# Patient Record
Sex: Male | Born: 1960 | Race: Black or African American | Hispanic: No | Marital: Single | Smoking: Never smoker
Health system: Southern US, Community
[De-identification: ages and names within clinical notes are randomized; demographics above are authoritative.]

## PROBLEM LIST (undated history)

## (undated) DIAGNOSIS — D471 Chronic myeloproliferative disease: Principal | ICD-10-CM

## (undated) DIAGNOSIS — E782 Mixed hyperlipidemia: Secondary | ICD-10-CM

## (undated) DIAGNOSIS — D75839 Thrombocytosis, unspecified: Secondary | ICD-10-CM

## (undated) DIAGNOSIS — D473 Essential (hemorrhagic) thrombocythemia: Secondary | ICD-10-CM

## (undated) DIAGNOSIS — R079 Chest pain, unspecified: Secondary | ICD-10-CM

---

## 2000-03-03 ENCOUNTER — Emergency Department (HOSPITAL_COMMUNITY): Admission: EM | Admit: 2000-03-03 | Discharge: 2000-03-03 | Payer: Self-pay | Admitting: Emergency Medicine

## 2000-03-03 ENCOUNTER — Encounter: Payer: Self-pay | Admitting: Emergency Medicine

## 2000-08-18 ENCOUNTER — Encounter: Payer: Self-pay | Admitting: Emergency Medicine

## 2000-08-18 ENCOUNTER — Emergency Department (HOSPITAL_COMMUNITY): Admission: EM | Admit: 2000-08-18 | Discharge: 2000-08-18 | Payer: Self-pay | Admitting: Emergency Medicine

## 2000-08-26 ENCOUNTER — Emergency Department (HOSPITAL_COMMUNITY): Admission: EM | Admit: 2000-08-26 | Discharge: 2000-08-26 | Payer: Self-pay | Admitting: Emergency Medicine

## 2001-12-07 ENCOUNTER — Encounter: Payer: Self-pay | Admitting: *Deleted

## 2001-12-07 ENCOUNTER — Emergency Department (HOSPITAL_COMMUNITY): Admission: EM | Admit: 2001-12-07 | Discharge: 2001-12-07 | Payer: Self-pay | Admitting: *Deleted

## 2003-02-15 ENCOUNTER — Emergency Department (HOSPITAL_COMMUNITY): Admission: EM | Admit: 2003-02-15 | Discharge: 2003-02-15 | Payer: Self-pay | Admitting: Emergency Medicine

## 2003-07-21 ENCOUNTER — Emergency Department (HOSPITAL_COMMUNITY): Admission: EM | Admit: 2003-07-21 | Discharge: 2003-07-21 | Payer: Self-pay | Admitting: Emergency Medicine

## 2003-10-15 ENCOUNTER — Inpatient Hospital Stay (HOSPITAL_COMMUNITY): Admission: EM | Admit: 2003-10-15 | Discharge: 2003-10-17 | Payer: Self-pay
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
## 2008-11-23 ENCOUNTER — Ambulatory Visit: Payer: Self-pay | Admitting: Psychiatry

## 2008-11-23 ENCOUNTER — Emergency Department (HOSPITAL_COMMUNITY): Admission: EM | Admit: 2008-11-23 | Discharge: 2008-11-23 | Payer: Self-pay | Admitting: Emergency Medicine

## 2008-11-23 ENCOUNTER — Inpatient Hospital Stay (HOSPITAL_COMMUNITY): Admission: AD | Admit: 2008-11-23 | Discharge: 2008-11-30 | Payer: Self-pay | Admitting: Psychiatry

## 2008-12-01 ENCOUNTER — Emergency Department (HOSPITAL_COMMUNITY): Admission: EM | Admit: 2008-12-01 | Discharge: 2008-12-01 | Payer: Self-pay | Admitting: Emergency Medicine

## 2008-12-01 ENCOUNTER — Inpatient Hospital Stay (HOSPITAL_COMMUNITY): Admission: RE | Admit: 2008-12-01 | Discharge: 2008-12-04 | Payer: Self-pay | Admitting: Psychiatry

## 2009-09-24 ENCOUNTER — Ambulatory Visit (HOSPITAL_COMMUNITY): Admission: RE | Admit: 2009-09-24 | Discharge: 2009-09-24 | Payer: Self-pay | Admitting: Psychiatry

## 2009-09-24 ENCOUNTER — Emergency Department (HOSPITAL_COMMUNITY): Admission: EM | Admit: 2009-09-24 | Discharge: 2009-09-25 | Payer: Self-pay | Admitting: Emergency Medicine

## 2009-09-25 ENCOUNTER — Ambulatory Visit: Payer: Self-pay | Admitting: Psychiatry

## 2009-09-25 ENCOUNTER — Inpatient Hospital Stay (HOSPITAL_COMMUNITY): Admission: RE | Admit: 2009-09-25 | Discharge: 2009-10-01 | Payer: Self-pay | Admitting: Psychiatry

## 2010-04-18 LAB — URINALYSIS, ROUTINE W REFLEX MICROSCOPIC
Bilirubin Urine: NEGATIVE
Glucose, UA: NEGATIVE mg/dL
Hgb urine dipstick: NEGATIVE
Ketones, ur: NEGATIVE mg/dL
Leukocytes, UA: NEGATIVE
Nitrite: NEGATIVE
Specific Gravity, Urine: 1.031 — ABNORMAL HIGH (ref 1.005–1.030)
Urobilinogen, UA: 1 mg/dL (ref 0.0–1.0)

## 2010-04-18 LAB — DIFFERENTIAL
Basophils Relative: 1 % (ref 0–1)
Eosinophils Relative: 5 % (ref 0–5)
Lymphocytes Relative: 27 % (ref 12–46)
Lymphs Abs: 2 10*3/uL (ref 0.7–4.0)
Monocytes Relative: 6 % (ref 3–12)
Neutro Abs: 4.4 10*3/uL (ref 1.7–7.7)

## 2010-04-18 LAB — BASIC METABOLIC PANEL
BUN: 15 mg/dL (ref 6–23)
CO2: 28 mEq/L (ref 19–32)
Calcium: 9.3 mg/dL (ref 8.4–10.5)
GFR calc non Af Amer: 58 mL/min — ABNORMAL LOW (ref >60)
Glucose, Bld: 118 mg/dL — ABNORMAL HIGH (ref 70–99)
Potassium: 3.5 mEq/L (ref 3.5–5.1)
Sodium: 139 mEq/L (ref 135–145)

## 2010-04-18 LAB — CBC
MCV: 86.7 fL (ref 78.0–100.0)
Platelets: 556 10*3/uL — ABNORMAL HIGH (ref 150–400)
RBC: 5.03 MIL/uL (ref 4.22–5.81)
WBC: 7.4 10*3/uL (ref 4.0–10.5)

## 2010-04-18 LAB — URINE MICROSCOPIC-ADD ON

## 2010-04-18 LAB — HEPATIC FUNCTION PANEL
Albumin: 3.8 g/dL (ref 3.5–5.2)
Alkaline Phosphatase: 73 U/L (ref 39–117)

## 2010-04-18 LAB — RAPID URINE DRUG SCREEN, HOSP PERFORMED
Cocaine: POSITIVE — AB
Opiates: NOT DETECTED

## 2010-04-18 LAB — TRICYCLICS SCREEN, URINE: TCA Scrn: NOT DETECTED

## 2010-05-08 LAB — COMPREHENSIVE METABOLIC PANEL
ALT: 36 U/L (ref 0–53)
Albumin: 3.9 g/dL (ref 3.5–5.2)
CO2: 28 mEq/L (ref 19–32)
Creatinine, Ser: 1.17 mg/dL (ref 0.4–1.5)
GFR calc Af Amer: >60 mL/min (ref >60)
GFR calc non Af Amer: >60 mL/min (ref >60)
Potassium: 4.3 mEq/L (ref 3.5–5.1)
Sodium: 137 mEq/L (ref 135–145)
Total Protein: 7.1 g/dL (ref 6.0–8.3)

## 2010-05-08 LAB — CBC
HCT: 42.3 % (ref 39.0–52.0)
Hemoglobin: 14.4 g/dL (ref 13.0–17.0)
Platelets: 414 10*3/uL — ABNORMAL HIGH (ref 150–400)
RBC: 4.74 MIL/uL (ref 4.22–5.81)
RDW: 14.4 % (ref 11.5–15.5)
WBC: 6.8 10*3/uL (ref 4.0–10.5)

## 2010-05-08 LAB — DIFFERENTIAL
Basophils Relative: 1 % (ref 0–1)
Eosinophils Absolute: 0.1 10*3/uL (ref 0.0–0.7)
Eosinophils Absolute: 0.2 10*3/uL (ref 0.0–0.7)
Eosinophils Relative: 1 % (ref 0–5)
Eosinophils Relative: 2 % (ref 0–5)
Lymphs Abs: 2 10*3/uL (ref 0.7–4.0)
Monocytes Relative: 7 % (ref 3–12)
Neutro Abs: 4.3 10*3/uL (ref 1.7–7.7)
Neutrophils Relative %: 63 % (ref 43–77)

## 2010-05-08 LAB — RAPID URINE DRUG SCREEN, HOSP PERFORMED
Amphetamines: NOT DETECTED
Barbiturates: NOT DETECTED
Benzodiazepines: NOT DETECTED
Opiates: NOT DETECTED
Tetrahydrocannabinol: NOT DETECTED

## 2010-05-08 LAB — BASIC METABOLIC PANEL
CO2: 29 mEq/L (ref 19–32)
Calcium: 9.6 mg/dL (ref 8.4–10.5)
Creatinine, Ser: 1.28 mg/dL (ref 0.4–1.5)
Glucose, Bld: 92 mg/dL (ref 70–99)

## 2010-05-08 LAB — ETHANOL
Alcohol, Ethyl (B): <5 mg/dL (ref 0–10)
Alcohol, Ethyl (B): <5 mg/dL (ref 0–10)
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
## 2010-06-20 NOTE — H&P (Signed)
NAME:  ED, Arthur Jensen                                ACCOUNT NO.:  [id number]   MEDICAL RECORD NO.:  [id number]                   PATIENT TYPE:  EMS   LOCATION:  MAJO                                 FACILITY:  [facility]   PHYSICIAN:  Marlan Palau, M.D.               DATE OF BIRTH:  09/09/2002   DATE OF ADMISSION:  10/15/2003  DATE OF DISCHARGE:                                HISTORY & PHYSICAL   HISTORY:  This is a patient of approximate age of 50 who is brought in for  an evaluation of altered mental status.   HISTORY OF PRESENT ILLNESS:  Arthur Jensen is an approximately 50 year old black  male born March 21, 1970, with a history of recent incarceration tonight  for possession of a cocaine pipe and stolen vehicle.  The patient initially  was conversive with the police, but was then told he was under arrest.  The  patient ran from the police, and in the course of his flight, tripped and  deputies caught up with him.  The patient was brought in for an evaluation  as the patient from that point on was not conversive, seemed to be confused  and agitated.   Lab studies were unremarkable, but urine drug screen is positive for  cocaine.  Neurology was asked to see this patient for altered mental status.  The patient has had a CT scan of the head.  By my review, it appeared to be  completely normal with and without contrast.  EKG was read as being atrial  fibrillation, but P waves are clearly present.  I suspect this is a normal  sinus rhythm evaluation.   PAST MEDICAL HISTORY:  Unknown. The patient does have a history of cocaine  abuse, recent altered mental status.   MEDICATIONS:  Prior to admission, unknown.   ALLERGIES:  Unknown.   SOCIAL HISTORY:  Unknown smoking and drinking history.  The patient's  address is unknown.  Social history is unknown whether this patient is  working or married.  It is unknown whether he has any children.   FAMILY HISTORY:  Once again, unknown.   REVIEW OF  SYSTEMS:  Unobtainable.  The patient is nonverbal.   PHYSICAL EXAMINATION:  VITAL SIGNS:  Blood pressure 119/93, heart rate 80,  respiratory rate 18, temperature afebrile.  GENERAL:  This patient is a well-developed black male who is apparently  alert but somewhat restless at the time of examination, nonverbal, moaning  in pain when touched anywhere on the body.  HEENT:  Head is atraumatic, eyes, pupils equal round and reactive to light.  Disks are flat.  NECK:  Relatively supple.  No carotid bruits are noted.  RESPIRATORY:  Examination is clear.  CARDIOVASCULAR:  Examination reveals a regular rate and rhythm, no obvious  murmurs or rubs noted.  ABDOMEN:  Reveals positive sounds, no organomegaly or tenderness noted.  EXTREMITIES:  Without significant edema.  NEUROLOGIC:  Examination is limited.  The patient has symmetric facies, good  symmetric grimace, nonverbal.  Blinks to threat bilaterally.  Full  extraocular movements were seen.  The patient has symmetric motor, tone, and  will not follow motor commands.  Does seem to have some response to pain  sensation on all fours.  Deep tendon reflexes are symmetric.  Toes are  downgoing bilaterally.  Cerebellar testing could not be performed.   LABORATORY DATA:  Notable for sodium of 140, potassium 4.1, chloride 107,  BUN 16, glucose of 77, pH 7.356, pCO2 47, bicarbonate of 26.  CO2 28,  hematocrit 44, hemoglobin of 15, white count of 5.9, hemoglobin 13.9,  hematocrit 41.3.  MCV of 88.0, platelets of 308.  Drug screen is notable for  cocaine, otherwise unremarkable.  White count of 5.9.  Again, hemoglobin  13.9, hematocrit 41.3.  Platelets of 308,000.   Chest x-ray shows no acute process.  CT of the head is normal.  C spine and  MRI scan was also done.  The results are pending.   IMPRESSION:  1.  Altered mental status.  2.  Positive use of cocaine.   This patient's identity is unknown.  The patient will be brought in for  evaluation of  his altered mental status.  Need to rule out psychogenic  response to incarceration.  Rule out seizure event.  Rule out other etiology  such as infectious causes such as herpes encephalitis and cerebrovascular  disease.  The patient is at risk for cocaine vasculitis.  The patient's  examination, however, is nonfocal.   PLAN:  1.  Admission to [facility].  2.  Monitored bed.  3.  N.p.o. for now.  4.  Swallowing evaluation in the a.m.  5.  EEG study.  6.  Consider MRI and lumbar puncture if unusual behavior is noted.  7.  Ativan for sedation if needed.  8.  We will follow the patient's clinical course while in house.                                                Marlan Palau, M.D.    CKW/MEDQ  D:  10/15/2003  T:  10/15/2003  Job:  [id number]   cc:   [facility]  [address] [place]. [address]

## 2010-06-20 NOTE — Procedures (Signed)
ELECTROENCEPHALOGRAPHY NUMBER:  [id number].   This patient is a 50 year old gentleman with a date of birth of September 06, 1960.   TECHNICIAN:  Michael Boston.   REFERRING PHYSICIAN:  Marlan Palau, M.D.   This is a portable EEG 16-channel study with one channel representing heart  rate and rhythm exclusively on this gentleman who is presenting aphasic and  is described as possibly confused.  The patient is also described as  lethargic by the technician.  The question of seizures is raised.  Further  information from the attached consult shows that the patient has undergone a  CT of the head which was normal.  The patient had apparently presented as  Jonny Ruiz Doe without any ability to communicate.  A toxicology screen was  positive.   At the beginning of the study, we were able to identify a posterior dominant  rhythm at about 9 Hz, which emits symmetrically and synchronously from both  posterior hemispheres.  The patient soon became drowsy as indicated by theta  range frequencies and then even shows sleep manifestations frequently seen  as a sharp spike over P4O2, P3O2 and P6O2 at the same time, which seems to  lack any field and is most likely an artifact as it is not seen during the  transverse montages.  The patient's study progresses not showing any focal  abnormalities or epileptiform discharges otherwise.  __________ maneuvers  were not marked.  This is a normal EEG for the patient's age and conscious  state.    Melvyn Novas, M.D.   AO:ZHYQ  D:  10/16/2003 13:06:02  T:  10/16/2003 15:56:00  Job #:  [id number]

## 2011-08-10 ENCOUNTER — Emergency Department (HOSPITAL_COMMUNITY)
Admission: EM | Admit: 2011-08-10 | Discharge: 2011-08-10 | Disposition: A | Discharge status: Home / Self Care | Payer: Self-pay | Attending: Emergency Medicine | Admitting: Emergency Medicine

## 2011-08-10 ENCOUNTER — Encounter (HOSPITAL_COMMUNITY): Payer: Self-pay | Admitting: Emergency Medicine

## 2011-08-10 DIAGNOSIS — R21 Rash and other nonspecific skin eruption: Secondary | ICD-10-CM | POA: Insufficient documentation

## 2011-08-10 MED ORDER — HYDROCORTISONE 1 % EX CREA: TOPICAL_CREAM | CUTANEOUS | Status: AC

## 2011-08-10 MED ORDER — DEXAMETHASONE SODIUM PHOSPHATE 10 MG/ML IJ SOLN
10.0000 mg | Freq: Once | INTRAMUSCULAR | Status: AC
Start: 2011-08-10 — End: 2011-08-10
  Administered 2011-08-10: 10 mg via INTRAMUSCULAR
  Filled 2011-08-10: qty 1

## 2011-08-10 MED ORDER — PREDNISONE 10 MG PO TABS: 40.0000 mg | ORAL_TABLET | Freq: Every day | ORAL | Status: DC

## 2011-08-10 NOTE — ED Notes (Signed)
2 week hx of multiple swollen, raised areas, itching. Family not affected

## 2011-08-10 NOTE — ED Provider Notes (Signed)
History     CSN: [id number]  Arrival date & time 08/10/11  1106   First MD Initiated Contact with Patient 08/10/11 1109      Chief Complaint  Patient presents with  . Rash    2 week hx of raised, itching rash    (Consider location/radiation/quality/duration/timing/severity/associated sxs/prior treatment) HPI Comments: Patient reports that he has had a rash for the past 2 weeks.  Rash is pruritic.  He has put Benadryl cream on the area, which he reports has helped.  No fever or chills.  No contacts with similar rash.  He denies any recent changes in lotions, detergents, or soaps.    Patient is a 51 y.o. male presenting with rash. The history is provided by the patient.  Rash  This is a new problem. Episode onset: two weeks. The problem has not changed since onset.The problem is associated with nothing. There has been no fever. The rash is present on the right arm, left arm, right lower leg and left lower leg. The patient is experiencing no pain. Associated symptoms include itching. Pertinent negatives include no blisters, no pain and no weeping.    History reviewed. No pertinent past medical history.  History reviewed. No pertinent past surgical history.  Family History  Problem Relation Age of Onset  . Hypertension Father     History  Substance Use Topics  . Smoking status: Never Smoker   . Smokeless tobacco: Not on file  . Alcohol Use: No      Review of Systems  Constitutional: Negative for fever and chills.  HENT: Negative for sore throat, neck pain and neck stiffness.   Respiratory: Negative for shortness of breath.   Cardiovascular: Negative for chest pain.  Gastrointestinal: Negative for nausea and vomiting.  Skin: Positive for itching and rash.  Neurological: Negative for headaches.    Allergies  Review of patient's allergies indicates no known allergies.  Home Medications   Current Outpatient Rx  Name Route Sig Dispense Refill  . DIPHENHYDRAMINE HCL 2 %  EX CREA Topical Apply topically 3 (three) times daily as needed.    Marland Kitchen HYDROCORTISONE 1 % EX CREA  Apply to affected area 2 times daily 15 g 0  . PREDNISONE 10 MG PO TABS Oral Take 4 tablets (40 mg total) by mouth daily. 10 tablet 0    BP 130/79  Pulse 62  Temp 98.3 F (36.8 C) (Oral)  Resp 18  SpO2 97%  Physical Exam  Nursing note and vitals reviewed. Constitutional: He appears well-developed and well-nourished. No distress.  HENT:  Head: Normocephalic and atraumatic.  Mouth/Throat: Oropharynx is clear and moist.  Neck: Normal range of motion. Neck supple.  Cardiovascular: Normal rate, regular rhythm and normal heart sounds.   Pulmonary/Chest: Effort normal and breath sounds normal.  Neurological: He is alert.  Skin: Skin is warm and dry. He is not diaphoretic.       Erythematous papular rash located on both arms, both legs, and posterior neck.  No petechiae or purpura.    Psychiatric: He has a normal mood and affect.    ED Course  Procedures (including critical care time)  Labs Reviewed - No data to display No results found.   1. Rash       MDM  Patient presenting with rash that has been present for the past 2 weeks.  No petechiae or purpura.  No pain.  Patient afebrile.  No systemic symptoms.  Patient discharged home.  Return precautions discussed.  Patient explained the importance of having a Primary Care Physician.        Pascal Lux [place], PA-C 08/10/11 [phone]

## 2011-08-10 NOTE — ED Provider Notes (Signed)
Medical screening examination/treatment/procedure(s) were performed by non-physician practitioner and as supervising physician I was immediately available for consultation/collaboration.  Doug Sou, MD 08/10/11 [phone]

## 2011-10-02 ENCOUNTER — Ambulatory Visit: Payer: Self-pay | Admitting: Internal Medicine

## 2013-09-20 DIAGNOSIS — D473 Essential (hemorrhagic) thrombocythemia: Secondary | ICD-10-CM | POA: Insufficient documentation

## 2013-09-20 DIAGNOSIS — K219 Gastro-esophageal reflux disease without esophagitis: Secondary | ICD-10-CM | POA: Insufficient documentation

## 2013-09-20 DIAGNOSIS — D649 Anemia, unspecified: Secondary | ICD-10-CM | POA: Insufficient documentation

## 2016-12-17 ENCOUNTER — Ambulatory Visit (INDEPENDENT_AMBULATORY_CARE_PROVIDER_SITE_OTHER): Payer: Self-pay | Admitting: Physician Assistant

## 2017-01-18 ENCOUNTER — Other Ambulatory Visit: Payer: Self-pay

## 2017-01-18 ENCOUNTER — Emergency Department (HOSPITAL_COMMUNITY)
Admission: EM | Admit: 2017-01-18 | Discharge: 2017-01-21 | Disposition: A | Discharge status: Home / Self Care | Payer: Self-pay | Attending: Emergency Medicine | Admitting: Emergency Medicine

## 2017-01-18 ENCOUNTER — Encounter (HOSPITAL_COMMUNITY): Payer: Self-pay | Admitting: Emergency Medicine

## 2017-01-18 DIAGNOSIS — F1410 Cocaine abuse, uncomplicated: Secondary | ICD-10-CM

## 2017-01-18 DIAGNOSIS — R45851 Suicidal ideations: Secondary | ICD-10-CM | POA: Insufficient documentation

## 2017-01-18 DIAGNOSIS — F1414 Cocaine abuse with cocaine-induced mood disorder: Secondary | ICD-10-CM | POA: Diagnosis present

## 2017-01-18 DIAGNOSIS — R0602 Shortness of breath: Secondary | ICD-10-CM | POA: Insufficient documentation

## 2017-01-18 LAB — CBC
HEMATOCRIT: 40.9 % (ref 39.0–52.0)
HEMOGLOBIN: 13.9 g/dL (ref 13.0–17.0)
MCH: 36.2 pg — ABNORMAL HIGH (ref 26.0–34.0)
MCHC: 34 g/dL (ref 30.0–36.0)
MCV: 106.5 fL — ABNORMAL HIGH (ref 78.0–100.0)
Platelets: 859 10*3/uL — ABNORMAL HIGH (ref 150–400)
RBC: 3.84 MIL/uL — AB (ref 4.22–5.81)
RDW: 13 % (ref 11.5–15.5)
WBC: 8.5 10*3/uL (ref 4.0–10.5)

## 2017-01-18 LAB — RAPID URINE DRUG SCREEN, HOSP PERFORMED
AMPHETAMINES: NOT DETECTED
BARBITURATES: NOT DETECTED
BENZODIAZEPINES: NOT DETECTED
COCAINE: POSITIVE — AB
OPIATES: NOT DETECTED
TETRAHYDROCANNABINOL: NOT DETECTED

## 2017-01-18 LAB — COMPREHENSIVE METABOLIC PANEL
ALBUMIN: 3.9 g/dL (ref 3.5–5.0)
ALK PHOS: 58 U/L (ref 38–126)
ALT: 22 U/L (ref 17–63)
ANION GAP: 7 (ref 5–15)
AST: 26 U/L (ref 15–41)
BUN: 13 mg/dL (ref 6–20)
CALCIUM: 8.9 mg/dL (ref 8.9–10.3)
CHLORIDE: 105 mmol/L (ref 101–111)
CO2: 27 mmol/L (ref 22–32)
Creatinine, Ser: 1.25 mg/dL — ABNORMAL HIGH (ref 0.61–1.24)
GFR calc Af Amer: >60 mL/min (ref >60)
GFR calc non Af Amer: >60 mL/min (ref >60)
GLUCOSE: 120 mg/dL — AB (ref 65–99)
Potassium: 3.4 mmol/L — ABNORMAL LOW (ref 3.5–5.1)
Sodium: 139 mmol/L (ref 135–145)
Total Bilirubin: 0.9 mg/dL (ref 0.3–1.2)
Total Protein: 6.6 g/dL (ref 6.5–8.1)

## 2017-01-18 LAB — ACETAMINOPHEN LEVEL

## 2017-01-18 LAB — ETHANOL: Alcohol, Ethyl (B): <10 mg/dL (ref <10)

## 2017-01-18 LAB — SALICYLATE LEVEL

## 2017-01-18 MED ORDER — LORAZEPAM 1 MG PO TABS
1.0000 mg | ORAL_TABLET | Freq: Once | ORAL | Status: AC
  Administered 2017-01-18: 1 mg via ORAL
  Filled 2017-01-18: qty 1

## 2017-01-18 NOTE — ED Triage Notes (Signed)
Pt presents by [employer] for evaluation of Suicidal ideation without any plan. Pt reports to using crack cocaine today and then began having a panic attack and suicidal ideation.

## 2017-01-18 NOTE — ED Notes (Signed)
Patient stated he feels better after the Ativan. Denies pain, SI/HI, AH/VH at this time. Safety maintained. Will continue to monitor patient.

## 2017-01-18 NOTE — BH Assessment (Addendum)
Assessment Note  Arthur Jensen is an 56 y.o. male. Pt came in after having thoughts of wanting to kill himself.  He stated he is tired of living and wants to die.  His plan is to jump off of the bridge on [address].  He stated he was upset because his daughter and mother are upset about his cocaine use.  His daughter was calling the pt and he reported this is why he did not jump.  He denies any previous attempts or any family history of suicide attempts in his family.  He had a suicidal gesture "15 years ago" where he had a gun in his hand and did not pull the trigger.  He was last hospitalized in 2011 and 2010 for mental health reasons.  He is not currently seeing a counselor or a psychiatrist.    The pt reported he goes on binges where he uses about 20-40 dollars worth of cocaine.  He has been using cocaine since he was 56 years old.  His last use of cocaine was today (01/18/2017).  He denied any other SA use.  His UDS was only positive for cocaine.  His blood alcohol level was 0.  He stated he has visual hallucinations.  Yesterday he saw the grim ripper.  The pt has been using cocaine for the past few days.  He stated he has not slept the past few days and has lost about 30 lbs in the past 2 months.  The pt is single and is currently homeless.  He denies any current criminal charges.  The pt denies HI  Diagnosis: F33.2 Major depressive disorder, Recurrent episode, Severe, F14.20 Cocaine use disorder, Severe    Past Medical History: History reviewed. No pertinent past medical history.  History reviewed. No pertinent surgical history.  Family History:  Family History  Problem Relation Age of Onset  . Hypertension Father     Social History:  reports that  has never smoked. he has never used smokeless tobacco. He reports that he drinks alcohol. He reports that he uses drugs. Drug: Cocaine.  Additional Social History:  Alcohol / Drug Use Prescriptions: See MAR History of alcohol  / drug use?: Yes Longest period of sobriety (when/how long): unknown Negative Consequences of Use: Legal Substance #1 Name of Substance 1: cocaine 1 - Age of First Use: 22 1 - Amount (size/oz): 20-40 dollars worth 1 - Frequency: "a few times a month" 1 - Duration: 30 years 1 - Last Use / Amount: 01/18/2017  CIWA: CIWA-Ar BP: (!) 138/93 Pulse Rate: 99 COWS:    Allergies: No Known Allergies  Home Medications:  (Not in a hospital admission)  OB/GYN Status:  No LMP for male patient.  General Assessment Data Location of Assessment: [facility] ED TTS Assessment: In system Is this a Tele or Face-to-Face Assessment?: Face-to-Face Is this an Initial Assessment or a Re-assessment for this encounter?: Initial Assessment Marital status: Single Maiden name: NA Living Arrangements: Alone, Other (Comment)(homeless) Can pt return to current living arrangement?: Yes Admission Status: Voluntary Is patient capable of signing voluntary admission?: Yes Referral Source: Self/Family/Friend Insurance type: None     Crisis Care Plan Living Arrangements: Alone, Other (Comment)(homeless) Legal Guardian: Other:(Self) Name of Psychiatrist: none Name of Therapist: none  Education Status Is patient currently in school?: No Current Grade: NA Highest grade of school patient has completed: 12th Name of school: NA Contact person: NA  Risk to self with the past 6 months Suicidal Ideation: Yes-Currently Present Has  patient been a risk to self within the past 6 months prior to admission? : Yes Suicidal Intent: Yes-Currently Present Has patient had any suicidal intent within the past 6 months prior to admission? : Yes Is patient at risk for suicide?: Yes Suicidal Plan?: Yes-Currently Present Has patient had any suicidal plan within the past 6 months prior to admission? : Yes Specify Current Suicidal Plan: [place] [address] Access to Means: Yes Specify Access to Suicidal Means: Can get  to the bridge What has been your use of drugs/alcohol within the last 12 months?: cocaine use Previous Attempts/Gestures: Yes How many times?: 1 Other Self Harm Risks: cocaine use Triggers for Past Attempts: Family contact Intentional Self Injurious Behavior: None Family Suicide History: No Recent stressful life event(s): Conflict (Comment)(arguement with mother and daughter) Persecutory voices/beliefs?: No Depression: Yes Depression Symptoms: Insomnia, Isolating, Fatigue, Guilt, Loss of interest in usual pleasures, Feeling worthless/self pity Substance abuse history and/or treatment for substance abuse?: Yes Suicide prevention information given to non-admitted patients: Not applicable  Risk to Others within the past 6 months Homicidal Ideation: No Does patient have any lifetime risk of violence toward others beyond the six months prior to admission? : No Thoughts of Harm to Others: No Current Homicidal Intent: No Current Homicidal Plan: No Access to Homicidal Means: No Identified Victim: NA History of harm to others?: No Assessment of Violence: None Noted Violent Behavior Description: NA Does patient have access to weapons?: No Criminal Charges Pending?: No Does patient have a court date: No Is patient on probation?: No  Psychosis Hallucinations: Visual Delusions: None noted  Mental Status Report Appearance/Hygiene: In scrubs, Unremarkable Eye Contact: Fair Motor Activity: Freedom of movement, Unremarkable Speech: Soft, Logical/coherent Level of Consciousness: Alert Mood: Depressed, Anxious Affect: Anxious, Depressed Anxiety Level: Moderate Thought Processes: Coherent, Relevant Judgement: Impaired Orientation: Person, Place, Time, Situation, Appropriate for developmental age Obsessive Compulsive Thoughts/Behaviors: None  Cognitive Functioning Concentration: Decreased Memory: Recent Intact, Remote Intact IQ: Average Insight: Poor Impulse Control: Poor Appetite:  Poor Weight Loss: 30 Weight Gain: 0 Sleep: Decreased Total Hours of Sleep: 3 Vegetative Symptoms: None  ADLScreening [facility] Assessment Services) Patient's cognitive ability adequate to safely complete daily activities?: Yes Patient able to express need for assistance with ADLs?: Yes Independently performs ADLs?: Yes (appropriate for developmental age)  Prior Inpatient Therapy Prior Inpatient Therapy: Yes Prior Therapy Dates: 2011, 2010 [facility] Reason for Treatment: depression and SA use  Prior Outpatient Therapy Prior Outpatient Therapy: No Prior Therapy Dates: NA Prior Therapy Facilty/Provider(s): NA Reason for Treatment: NA Does patient have an ACCT team?: No Does patient have Intensive In-House Services?  : No Does patient have [facility] services? : No Does patient have P4CC services?: No  ADL Screening (condition at time of admission) Patient's cognitive ability adequate to safely complete daily activities?: Yes Patient able to express need for assistance with ADLs?: Yes Independently performs ADLs?: Yes (appropriate for developmental age)       Abuse/Neglect Assessment (Assessment to be complete while patient is alone) Abuse/Neglect Assessment Can Be Completed: Yes Physical Abuse: Denies Verbal Abuse: Denies Sexual Abuse: Denies Exploitation of patient/patient's resources: Denies Self-Neglect: Denies Values / Beliefs Cultural Requests During Hospitalization: None Spiritual Requests During Hospitalization: None Consults Spiritual Care Consult Needed: No Social Work Consult Needed: No Merchant navy officerAdvance Directives (For Healthcare) Does Patient Have a Medical Advance Directive?: No    Additional Information 1:1 In Past 12 Months?: No CIRT Risk: No Elopement Risk: No Does patient  have medical clearance?: Yes     Disposition:  Disposition Initial Assessment Completed for this Encounter: Yes Disposition of Patient: Pending Review  with psychiatrist   Spoke with Karleen HampshireSpencer, NP and he recommended pt to be observed overnight for safety and stabilization.  Will be reassessed in the AM.  Spoke with Dr. Lynelle DoctorKnapp and informed him of the disposition.   On Site Evaluation by:   Reviewed with Physician:    Ottis StainGarvin, Ahnika Hannibal Jermaine 01/18/2017 9:44 PM

## 2017-01-18 NOTE — ED Provider Notes (Signed)
[facility]-EMERGENCY DEPT Provider Note   CSN: [id number] Arrival date & time: 01/18/17  1932     History   Chief Complaint Chief Complaint  Patient presents with  . Suicidal    HPI Arthur Jensen H Swaminathan is a 56 y.o. male.  HPI Patient presented to the emergency room for evaluation of suicidal ideation.  Patient states he was using cocaine today.  It made him feel very anxious.  He started to feel depressed and suicidal.  He does not have any specific plan.  Patient states he has a remote history of having trouble with depression in the past.  He denies any trouble with chest pain.  He did feel little short of breath earlier.  He denies any vomiting diarrhea numbness or other complaints. History reviewed. No pertinent past medical history.  There are no active problems to display for this patient.   History reviewed. No pertinent surgical history.     Home Medications    Prior to Admission medications   Medication Sig Start Date End Date Taking? Authorizing Provider  diphenhydrAMINE (BENADRYL) 2 % cream Apply topically 3 (three) times daily as needed.    [provider]  predniSONE (DELTASONE) 10 MG tablet Take 4 tablets (40 mg total) by mouth daily. 08/10/11   Santiago GladLaisure, Heather, PA-C    Family History Family History  Problem Relation Age of Onset  . Hypertension Father     Social History Social History   Tobacco Use  . Smoking status: Never Smoker  . Smokeless tobacco: Never Used  Substance Use Topics  . Alcohol use: Yes  . Drug use: Yes    Types: Cocaine     Allergies   Patient has no known allergies.   Review of Systems Review of Systems  All other systems reviewed and are negative.    Physical Exam Updated Vital Signs BP (!) 138/93 (BP Location: Left Arm)   Pulse 99   Temp 98.9 F (37.2 C) (Oral)   Resp 19   Ht 1.791 m (5' 10.5")   Wt 90.7 kg (200 lb)   SpO2 100%   BMI 28.29 kg/m   Physical Exam    Constitutional: He appears well-developed and well-nourished. No distress.  HENT:  Head: Normocephalic and atraumatic.  Right Ear: External ear normal.  Left Ear: External ear normal.  Eyes: Conjunctivae are normal. Right eye exhibits no discharge. Left eye exhibits no discharge. No scleral icterus.  Neck: Neck supple. No tracheal deviation present.  Cardiovascular: Normal rate, regular rhythm and intact distal pulses.  Pulmonary/Chest: Effort normal and breath sounds normal. No stridor. No respiratory distress. He has no wheezes. He has no rales.  Abdominal: Soft. Bowel sounds are normal. He exhibits no distension. There is no tenderness. There is no rebound and no guarding.  Musculoskeletal: He exhibits no edema or tenderness.  Neurological: He is alert. He has normal strength. No cranial nerve deficit (no facial droop, extraocular movements intact, no slurred speech) or sensory deficit. He exhibits normal muscle tone. He displays no seizure activity. Coordination normal.  Skin: Skin is warm and dry. No rash noted.  Psychiatric: His mood appears anxious. He expresses suicidal ideation.  Nursing note and vitals reviewed.    ED Treatments / Results  Labs (all labs ordered are listed, but only abnormal results are displayed) Labs Reviewed  COMPREHENSIVE METABOLIC PANEL - Abnormal; Notable for the following components:      Result Value   Potassium 3.4 (*)  Glucose, Bld 120 (*)    Creatinine, Ser 1.25 (*)    All other components within normal limits  ACETAMINOPHEN LEVEL - Abnormal; Notable for the following components:   Acetaminophen (Tylenol), Serum <10 (*)    All other components within normal limits  CBC - Abnormal; Notable for the following components:   RBC 3.84 (*)    MCV 106.5 (*)    MCH 36.2 (*)    Platelets 859 (*)    All other components within normal limits  RAPID URINE DRUG SCREEN, HOSP PERFORMED - Abnormal; Notable for the following components:   Cocaine POSITIVE  (*)    All other components within normal limits  ETHANOL  SALICYLATE LEVEL    EKG  EKG Interpretation  Date/Time:  Monday January 18 2017 20:01:33 EST Ventricular Rate:  85 PR Interval:    QRS Duration: 91 QT Interval:  355 QTC Calculation: 425 R Axis:   23 Text Interpretation:  Sinus rhythm Borderline T abnormalities, inferior leads Baseline wander in lead(s) V4 V6 No old tracing to compare Confirmed by Linwood DibblesKnapp, Latoya Maulding [phone]([id number]) on 01/18/2017 8:14:35 PM       Radiology No results found.  Procedures Procedures (including critical care time)  Medications Ordered in ED Medications  LORazepam (ATIVAN) tablet 1 mg (1 mg Oral Given 01/18/17 2043)     Initial Impression / Assessment and Plan / ED Course  I have reviewed the triage vital signs and the nursing notes.  Pertinent labs & imaging results that were available during my care of the patient were reviewed by me and considered in my medical decision making (see chart for details).   Patient presented to the emergency room for evaluation of suicidal ideation associated with crack cocaine abuse.  Patient's vital signs are stable.  I do not see any signs of any serious complications associated continues today.  The patient is medically cleared to be evaluated by the behavioral health team for his suicidal ideation  Final Clinical Impressions(s) / ED Diagnoses   Final diagnoses:  Suicidal ideation  Cocaine abuse [facility](HCC)    ED Discharge Orders    None       Linwood DibblesKnapp, Norva Bowe, MD 01/18/17 2106

## 2017-01-18 NOTE — ED Notes (Signed)
Pt reports improvement in anxiety after Ativan.

## 2017-01-19 ENCOUNTER — Emergency Department (HOSPITAL_COMMUNITY): Payer: Self-pay

## 2017-01-19 DIAGNOSIS — F1414 Cocaine abuse with cocaine-induced mood disorder: Secondary | ICD-10-CM | POA: Diagnosis present

## 2017-01-19 DIAGNOSIS — R45851 Suicidal ideations: Secondary | ICD-10-CM

## 2017-01-19 DIAGNOSIS — F1494 Cocaine use, unspecified with cocaine-induced mood disorder: Secondary | ICD-10-CM

## 2017-01-19 LAB — URINALYSIS, ROUTINE W REFLEX MICROSCOPIC
Bilirubin Urine: NEGATIVE
Glucose, UA: NEGATIVE mg/dL
Hgb urine dipstick: NEGATIVE
KETONES UR: NEGATIVE mg/dL
LEUKOCYTES UA: NEGATIVE
NITRITE: NEGATIVE
PH: 7 (ref 5.0–8.0)
Protein, ur: NEGATIVE mg/dL
Specific Gravity, Urine: 1.018 (ref 1.005–1.030)

## 2017-01-19 LAB — CBC
HEMATOCRIT: 38.8 % — AB (ref 39.0–52.0)
Hemoglobin: 12.9 g/dL — ABNORMAL LOW (ref 13.0–17.0)
MCH: 35.9 pg — ABNORMAL HIGH (ref 26.0–34.0)
MCHC: 33.2 g/dL (ref 30.0–36.0)
MCV: 108.1 fL — ABNORMAL HIGH (ref 78.0–100.0)
Platelets: 730 10*3/uL — ABNORMAL HIGH (ref 150–400)
RBC: 3.59 MIL/uL — ABNORMAL LOW (ref 4.22–5.81)
RDW: 13.1 % (ref 11.5–15.5)
WBC: 7.7 10*3/uL (ref 4.0–10.5)

## 2017-01-19 LAB — INFLUENZA PANEL BY PCR (TYPE A & B)
INFLBPCR: NEGATIVE
Influenza A By PCR: POSITIVE — AB

## 2017-01-19 MED ORDER — OSELTAMIVIR PHOSPHATE 75 MG PO CAPS
75.0000 mg | ORAL_CAPSULE | Freq: Two times a day (BID) | ORAL | Status: DC
  Administered 2017-01-19 – 2017-01-21 (×5): 75 mg via ORAL
  Filled 2017-01-19 (×5): qty 1

## 2017-01-19 MED ORDER — ACETAMINOPHEN 325 MG PO TABS
650.0000 mg | ORAL_TABLET | Freq: Four times a day (QID) | ORAL | Status: DC | PRN
  Administered 2017-01-19 – 2017-01-20 (×4): 650 mg via ORAL
  Filled 2017-01-19 (×4): qty 2

## 2017-01-19 MED ORDER — ALBUTEROL SULFATE HFA 108 (90 BASE) MCG/ACT IN AERS
2.0000 | INHALATION_SPRAY | RESPIRATORY_TRACT | Status: DC | PRN
  Administered 2017-01-19 – 2017-01-20 (×2): 2 via RESPIRATORY_TRACT
  Filled 2017-01-19 (×2): qty 6.7

## 2017-01-19 MED ORDER — ACETAMINOPHEN 500 MG PO TABS
1000.0000 mg | ORAL_TABLET | Freq: Once | ORAL | Status: AC
  Administered 2017-01-19: 1000 mg via ORAL
  Filled 2017-01-19: qty 2

## 2017-01-19 NOTE — Consult Note (Signed)
[facility] Face-to-Face Psychiatry Consult   Reason for Consult:  SI Referring Physician:  EDP Patient Identification: Arthur Jensen MRN:  [id number] Principal Diagnosis: Cocaine abuse with cocaine-induced mood disorder ([place]) Diagnosis:  There are no active problems to display for this patient.   Total Time spent with patient: 45 minutes  Subjective:   Arthur Jensen is a 56 y.o. male patient admitted with SI.  HPI:   Per chart review, patient was admitted with SI and a plan to jump off a bridge after an argument with his daughter and mother who were upset about his cocaine use. He aborted his attempt because his daughter called him. Today he reports ongoing suicidal thoughts. He is upset that he has disappointed so many people. He reports that he is homeless and was recently kicked out of his mother's house. He is not taking medications for his mood. He reports CAH to end his life.   Past Psychiatric History: Cocaine abuse and alcohol abuse.  Risk to Self: Suicidal Ideation: Yes-Currently Present Suicidal Intent: Yes-Currently Present Is patient at risk for suicide?: Yes Suicidal Plan?: Yes-Currently Present Specify Current Suicidal Plan: Jump off bridge on [address] Access to Means: Yes Specify Access to Suicidal Means: Can get to the bridge What has been your use of drugs/alcohol within the last 12 months?: cocaine use How many times?: 1 Other Self Harm Risks: cocaine use Triggers for Past Attempts: Family contact Intentional Self Injurious Behavior: None Risk to Others: Homicidal Ideation: No Thoughts of Harm to Others: No Current Homicidal Intent: No Current Homicidal Plan: No Access to Homicidal Means: No Identified Victim: NA History of harm to others?: No Assessment of Violence: None Noted Violent Behavior Description: NA Does patient have access to weapons?: No Criminal Charges Pending?: No Does patient have a court date: No Prior Inpatient Therapy: Prior  Inpatient Therapy: Yes Prior Therapy Dates: 2011, 2010 Prior Therapy Facilty/Provider(s): He reports [facility] but he has no documented medical records from hospitalization at [facility].  Reason for Treatment: depression and SA use Prior Outpatient Therapy: Prior Outpatient Therapy: No Prior Therapy Dates: NA Prior Therapy Facilty/Provider(s): NA Reason for Treatment: NA Does patient have an ACCT team?: No Does patient have Intensive In-House Services?  : No Does patient have [facility] services? : No Does patient have P4CC services?: No  Past Medical History: History reviewed. No pertinent past medical history. History reviewed. No pertinent surgical history. Family History:  Family History  Problem Relation Age of Onset  . Hypertension Father    Family Psychiatric  History: His mother has a history of self-injurious behaviors.  Social History:  Social History   Substance and Sexual Activity  Alcohol Use Yes     Social History   Substance and Sexual Activity  Drug Use Yes  . Types: Cocaine    Social History   Socioeconomic History  . Marital status: Single    Spouse name: None  . Number of children: None  . Years of education: None  . Highest education level: None  Social Needs  . Financial resource strain: None  . Food insecurity - worry: None  . Food insecurity - inability: None  . Transportation needs - medical: None  . Transportation needs - non-medical: None  Occupational History  . None  Tobacco Use  . Smoking status: Never Smoker  . Smokeless tobacco: Never Used  Substance and Sexual Activity  . Alcohol use: Yes  . Drug use: Yes    Types: Cocaine  . Sexual  activity: None  Other Topics Concern  . None  Social History Narrative  . None   Additional Social History: He is unemployed and homeless. He denies other illicit substance use besides cocaine. He has a history of alcohol use.     Allergies:  No Known Allergies  Labs:  Results for orders placed or  performed during the hospital encounter of 01/18/17 (from the past 48 hour(s))  Comprehensive metabolic panel     Status: Abnormal   Collection Time: 01/18/17  8:10 PM  Result Value Ref Range   Sodium 139 135 - 145 mmol/L   Potassium 3.4 (L) 3.5 - 5.1 mmol/L   Chloride 105 101 - 111 mmol/L   CO2 27 22 - 32 mmol/L   Glucose, Bld 120 (H) 65 - 99 mg/dL   BUN 13 6 - 20 mg/dL   Creatinine, Ser 1.25 (H) 0.61 - 1.24 mg/dL   Calcium 8.9 8.9 - 10.3 mg/dL   Total Protein 6.6 6.5 - 8.1 g/dL   Albumin 3.9 3.5 - 5.0 g/dL   AST 26 15 - 41 U/L   ALT 22 17 - 63 U/L   Alkaline Phosphatase 58 38 - 126 U/L   Total Bilirubin 0.9 0.3 - 1.2 mg/dL   GFR calc non Af Amer >60 >60 mL/min   GFR calc Af Amer >60 >60 mL/min    Comment: (NOTE) The eGFR has been calculated using the CKD EPI equation. This calculation has not been validated in all clinical situations. eGFR's persistently <60 mL/min signify possible Chronic Kidney Disease.    Anion gap 7 5 - 15  Ethanol     Status: None   Collection Time: 01/18/17  8:10 PM  Result Value Ref Range   Alcohol, Ethyl (B) <10 <10 mg/dL    Comment:        LOWEST DETECTABLE LIMIT FOR SERUM ALCOHOL IS 10 mg/dL FOR MEDICAL PURPOSES ONLY   Salicylate level     Status: None   Collection Time: 01/18/17  8:10 PM  Result Value Ref Range   Salicylate Lvl <7.1 2.8 - 30.0 mg/dL  Acetaminophen level     Status: Abnormal   Collection Time: 01/18/17  8:10 PM  Result Value Ref Range   Acetaminophen (Tylenol), Serum <10 (L) 10 - 30 ug/mL    Comment:        THERAPEUTIC CONCENTRATIONS VARY SIGNIFICANTLY. A RANGE OF 10-30 ug/mL MAY BE AN EFFECTIVE CONCENTRATION FOR MANY PATIENTS. HOWEVER, SOME ARE BEST TREATED AT CONCENTRATIONS OUTSIDE THIS RANGE. ACETAMINOPHEN CONCENTRATIONS >150 ug/mL AT 4 HOURS AFTER INGESTION AND >50 ug/mL AT 12 HOURS AFTER INGESTION ARE OFTEN ASSOCIATED WITH TOXIC REACTIONS.   cbc     Status: Abnormal   Collection Time: 01/18/17  8:10 PM   Result Value Ref Range   WBC 8.5 4.0 - 10.5 K/uL   RBC 3.84 (L) 4.22 - 5.81 MIL/uL   Hemoglobin 13.9 13.0 - 17.0 g/dL   HCT 40.9 39.0 - 52.0 %   MCV 106.5 (H) 78.0 - 100.0 fL   MCH 36.2 (H) 26.0 - 34.0 pg   MCHC 34.0 30.0 - 36.0 g/dL   RDW 13.0 11.5 - 15.5 %   Platelets 859 (H) 150 - 400 K/uL  Rapid urine drug screen (hospital performed)     Status: Abnormal   Collection Time: 01/18/17  8:28 PM  Result Value Ref Range   Opiates NONE DETECTED NONE DETECTED   Cocaine POSITIVE (A) NONE DETECTED   Benzodiazepines NONE DETECTED NONE  DETECTED   Amphetamines NONE DETECTED NONE DETECTED   Tetrahydrocannabinol NONE DETECTED NONE DETECTED   Barbiturates NONE DETECTED NONE DETECTED    Comment:        DRUG SCREEN FOR MEDICAL PURPOSES ONLY.  IF CONFIRMATION IS NEEDED FOR ANY PURPOSE, NOTIFY LAB WITHIN 5 DAYS.        LOWEST DETECTABLE LIMITS FOR URINE DRUG SCREEN Drug Class       Cutoff (ng/mL) Amphetamine      1000 Barbiturate      200 Benzodiazepine   174 Tricyclics       944 Opiates          300 Cocaine          300 THC              50   CBC     Status: Abnormal   Collection Time: 01/19/17 10:02 AM  Result Value Ref Range   WBC 7.7 4.0 - 10.5 K/uL   RBC 3.59 (L) 4.22 - 5.81 MIL/uL   Hemoglobin 12.9 (L) 13.0 - 17.0 g/dL   HCT 38.8 (L) 39.0 - 52.0 %   MCV 108.1 (H) 78.0 - 100.0 fL   MCH 35.9 (H) 26.0 - 34.0 pg   MCHC 33.2 30.0 - 36.0 g/dL   RDW 13.1 11.5 - 15.5 %   Platelets 730 (H) 150 - 400 K/uL  Urinalysis, Routine w reflex microscopic     Status: None   Collection Time: 01/19/17 10:03 AM  Result Value Ref Range   Color, Urine YELLOW YELLOW   APPearance CLEAR CLEAR   Specific Gravity, Urine 1.018 1.005 - 1.030   pH 7.0 5.0 - 8.0   Glucose, UA NEGATIVE NEGATIVE mg/dL   Hgb urine dipstick NEGATIVE NEGATIVE   Bilirubin Urine NEGATIVE NEGATIVE   Ketones, ur NEGATIVE NEGATIVE mg/dL   Protein, ur NEGATIVE NEGATIVE mg/dL   Nitrite NEGATIVE NEGATIVE   Leukocytes, UA  NEGATIVE NEGATIVE    Current Facility-Administered Medications  Medication Dose Route Frequency Provider Last Rate Last Dose  . acetaminophen (TYLENOL) tablet 650 mg  650 mg Oral Q6H PRN Pfeiffer, Jeannie Done, MD      . albuterol (PROVENTIL HFA;VENTOLIN HFA) 108 (90 Base) MCG/ACT inhaler 2 puff  2 puff Inhalation Q4H PRN Charlesetta Shanks, MD   2 puff at 01/19/17 1031   No current outpatient medications on file.    Musculoskeletal: Strength & Muscle Tone: within normal limits Gait & Station: normal Patient leans: N/A  Psychiatric Specialty Exam: Physical Exam  Nursing note and vitals reviewed. Constitutional: He is oriented to person, place, and time. He appears well-developed and well-nourished.  HENT:  Head: Normocephalic and atraumatic.  Neck: Normal range of motion.  Respiratory: Effort normal.  Musculoskeletal: Normal range of motion.  Neurological: He is alert and oriented to person, place, and time.  Skin: No rash noted.  Psychiatric: His speech is normal and behavior is normal. Thought content normal. Cognition and memory are normal. He exhibits a depressed mood.    Review of Systems  Constitutional: Negative for chills and fever.  Respiratory: Positive for cough.   Cardiovascular: Positive for chest pain.  Gastrointestinal: Negative for nausea and vomiting.  Psychiatric/Behavioral: Positive for depression, hallucinations (CAH), substance abuse and suicidal ideas. The patient is not nervous/anxious.     Blood pressure 128/72, pulse 90, temperature (!) 100.9 F (38.3 C), resp. rate (!) 26, height 5' 10.5" (1.791 m), weight 90.7 kg (200 lb), SpO2 99 %.Body mass index is 28.29  kg/m.  General Appearance: Well Groomed, middle aged, African American male who is wearing paper hospital scrubs and lying in bed. NAD.   Eye Contact:  Good  Speech:  Clear and Coherent and Normal Rate  Volume:  Normal  Mood:  Depressed  Affect:  Congruent and Depressed  Thought Process:  Goal Directed  and Linear  Orientation:  Full (Time, Place, and Person)  Thought Content:  Logical  Suicidal Thoughts:  Yes.  with intent/plan  Homicidal Thoughts:  No  Memory:  Immediate;   Good Recent;   Good Remote;   Good  Judgement:  Fair  Insight:  Fair  Psychomotor Activity:  Normal  Concentration:  Concentration: Good and Attention Span: Good  Recall:  Good  Fund of Knowledge:  Good  Language:  Good  Akathisia:  No  Handed:  Right  AIMS (if indicated):   N/A  Assets:  Social Support  ADL's:  Intact  Cognition:  WNL  Sleep:   N/A   Assessment: Arthur Jensen is a 56 y.o. male who presents with SI with a plan in the setting of psychosocial stressors including homelessness. He reports cocaine use. He warrants inpatient psychiatric hospitalization for stabilization and treatment.   Treatment Plan Summary: Daily contact with patient to assess and evaluate symptoms and progress in treatment and Medication management  -Patient will need to be seen by EDP due to cough and fever. He reports chronic thrombocytosis and takes a medication but is unable to identify the medication name.   Disposition: Recommend psychiatric Inpatient admission when medically cleared.  Faythe Dingwall, DO 01/19/2017 11:41 AM

## 2017-01-19 NOTE — ED Provider Notes (Signed)
Asked by nursing staff to see patient this morning for fever of 1-2.3 with cough.  Patient is admitted for suicidal ideation and drug abuse.  Patient reports he has had a cough for about 3 days.  He reports he had some body aches.  He was not aware of fever until today.  He reports it does hurt in the center of his chest when he coughs.  Minimal nasal congestion sore throat.  Patient reports he sometimes feels short of breath with cough paroxysmal.   Patient is alert and appropriate.  No respiratory distress.  Mental status clear.  Patient has cough paroxysmal with deep inspiration.  Scattered expiratory wheeze.  No gross rhonchi.  Heart is regular no tachycardia.  Skin warm and dry.  Will obtain 2 view chest x-ray, influenza swab, administer acetaminophen for fever and albuterol inhaler for wheeze.  Has tested positive for influenza.  He is nontoxic and alert.  He does not have respiratory distress.  Will initiate Tamiflu.  Will have to treat the patient and have 48 hours free of fever for definitive placement.  Can continue albuterol inhalers and Tylenol for fever and body aches.   Arby BarrettePfeiffer, Anay Rathe, MD 01/19/17 [phone]

## 2017-01-19 NOTE — ED Notes (Signed)
Bed: WA31 Expected date:  Expected time:  Means of arrival:  Comments: 

## 2017-01-19 NOTE — ED Notes (Signed)
Error in charting.  650mg  Tylenol not given just 1000mg  dose.

## 2017-01-19 NOTE — ED Notes (Signed)
Dr. Donnald GarrePfeiffer notifed about patient's increased temperature and complaints of cough and chest congestion.

## 2017-01-20 MED ORDER — GUAIFENESIN 200 MG PO TABS
200.0000 mg | ORAL_TABLET | ORAL | Status: DC | PRN
  Administered 2017-01-20: 200 mg via ORAL
  Filled 2017-01-20: qty 1

## 2017-01-20 MED ORDER — ALUM & MAG HYDROXIDE-SIMETH 200-200-20 MG/5ML PO SUSP
15.0000 mL | Freq: Once | ORAL | Status: AC
  Administered 2017-01-20: 15 mL via ORAL
  Filled 2017-01-20: qty 30

## 2017-01-20 MED ORDER — BENZONATATE 100 MG PO CAPS
100.0000 mg | ORAL_CAPSULE | Freq: Three times a day (TID) | ORAL | Status: DC | PRN
  Administered 2017-01-20: 100 mg via ORAL
  Filled 2017-01-20: qty 1

## 2017-01-20 NOTE — ED Notes (Signed)
No respiratory or acute distress noted alert and oriented x 3 call light in reach hospital sitter at bedside. 

## 2017-01-20 NOTE — ED Notes (Signed)
No respiratory or acute distress noted resting in bed with eyes closed hospital sitter watching pt. Call light in reach.

## 2017-01-20 NOTE — ED Notes (Signed)
Pt is alert and oriented x 4 and is verbally responsive. Pt  c/o heartburn. Pt temp rechecked and  Is 100.3

## 2017-01-20 NOTE — ED Notes (Signed)
Per Elijah Birkom, [facility] and [facility] medically.

## 2017-01-20 NOTE — BH Assessment (Signed)
[facility] Assessment Progress Note  Pt re-assessed today. Pt continues to endorse SI. He denies HI, AVH. Pt complains of being hot and is breathing heavily. He is aware that placement cannot be sought for him until he is off of droplet precaution. IP treatment still recommended.   Johny ShockSamantha M. Ladona Ridgelaylor, MS, NCC, LPCA Counselor

## 2017-01-21 DIAGNOSIS — F1910 Other psychoactive substance abuse, uncomplicated: Secondary | ICD-10-CM

## 2017-01-21 DIAGNOSIS — F1414 Cocaine abuse with cocaine-induced mood disorder: Secondary | ICD-10-CM

## 2017-01-21 MED ORDER — OSELTAMIVIR PHOSPHATE 75 MG PO CAPS: 75.0000 mg | ORAL_CAPSULE | Freq: Two times a day (BID) | ORAL | 0 refills | Status: DC

## 2017-01-21 MED ORDER — ALBUTEROL SULFATE HFA 108 (90 BASE) MCG/ACT IN AERS: 2.0000 | INHALATION_SPRAY | RESPIRATORY_TRACT | 0 refills | Status: AC | PRN

## 2017-01-21 MED ORDER — GUAIFENESIN 200 MG PO TABS: 200.0000 mg | ORAL_TABLET | ORAL | 0 refills | Status: AC | PRN

## 2017-01-21 MED ORDER — BENZONATATE 100 MG PO CAPS: 100.0000 mg | ORAL_CAPSULE | Freq: Three times a day (TID) | ORAL | 0 refills | Status: DC | PRN

## 2017-01-21 NOTE — ED Notes (Signed)
No reaction to medication noted resting in bed with eyes closed call light in reach no respiratory or acute distress noted.

## 2017-01-21 NOTE — Consult Note (Signed)
[facility]   Reason for Consult:  Cocaine abuse with suicidal ideations Referring Physician:  EDP Patient Identification: Arthur Jensen Godlewski MRN:  [id number] Principal Diagnosis: Cocaine abuse with cocaine-induced mood disorder [facility](HCC) Diagnosis:   Patient Active Problem List   Diagnosis Date Noted  . Cocaine abuse with cocaine-induced mood disorder [facility](HCC) [F14.14] 01/19/2017    Priority: High    Total Time spent with patient: 30 minutes  Subjective:   Arthur Jensen Canner is a 56 y.o. male patient is stable for discharge.  HPI:  56 yo male who came to the ED a few days ago after using cocaine and having suicidal ideations.  Today, he is clear and coherent with no suicidal/homicidal ideations, hallucinations, or withdrawal symptoms.  Stable for discharge, encouraged to use his follow-up resources.    Past Psychiatric History: substance abuse  Risk to Self: None Risk to Others: Homicidal Ideation: No Thoughts of Harm to Others: No Current Homicidal Intent: No Current Homicidal Plan: No Access to Homicidal Means: No Identified Victim: NA History of harm to others?: No Assessment of Violence: None Noted Violent Behavior Description: NA Does patient have access to weapons?: No Criminal Charges Pending?: No Does patient have a court date: No Prior Inpatient Therapy: Prior Inpatient Therapy: Yes Prior Therapy Dates: 2011, 2010 [facility] Reason for Treatment: depression and SA use Prior Outpatient Therapy: Prior Outpatient Therapy: No Prior Therapy Dates: NA Prior Therapy Facilty/Provider(s): NA Reason for Treatment: NA Does patient have an ACCT team?: No Does patient have Intensive In-House Services?  : No Does patient have [facility] services? : No Does patient have P4CC services?: No  Past Medical History: History reviewed. No pertinent past medical history. History reviewed. No pertinent surgical history. Family History:   Family History  Problem Relation Age of Onset  . Hypertension Father    Family Psychiatric  History: none Social History:  Social History   Substance and Sexual Activity  Alcohol Use Yes     Social History   Substance and Sexual Activity  Drug Use Yes  . Types: Cocaine    Social History   Socioeconomic History  . Marital status: Single    Spouse name: None  . Number of children: None  . Years of education: None  . Highest education level: None  Social Needs  . Financial resource strain: None  . Food insecurity - worry: None  . Food insecurity - inability: None  . Transportation needs - medical: None  . Transportation needs - non-medical: None  Occupational History  . None  Tobacco Use  . Smoking status: Never Smoker  . Smokeless tobacco: Never Used  Substance and Sexual Activity  . Alcohol use: Yes  . Drug use: Yes    Types: Cocaine  . Sexual activity: None  Other Topics Concern  . None  Social History Narrative  . None   Additional Social History: N/A    Allergies:  No Known Allergies  Labs:  Results for orders placed or performed during the hospital encounter of 01/18/17 (from the past 48 hour(s))  Influenza panel by PCR (type A & B)     Status: Abnormal   Collection Time: 01/19/17 11:50 AM  Result Value Ref Range   Influenza A By PCR POSITIVE (A) NEGATIVE   Influenza B By PCR NEGATIVE NEGATIVE    Comment: (NOTE) The Xpert Xpress Flu assay is intended as an aid in the diagnosis of  influenza and should not be used as  a sole basis for treatment.  This  assay is FDA approved for nasopharyngeal swab specimens only. Nasal  washings and aspirates are unacceptable for Xpert Xpress Flu testing.     Current Facility-Administered Medications  Medication Dose Route Frequency Provider Last Rate Last Dose  . acetaminophen (TYLENOL) tablet 650 mg  650 mg Oral Q6H PRN Arby BarrettePfeiffer, Marcy, MD   650 mg at 01/20/17 1214  . albuterol (PROVENTIL HFA;VENTOLIN HFA) 108  (90 Base) MCG/ACT inhaler 2 puff  2 puff Inhalation Q4H PRN Arby BarrettePfeiffer, Marcy, MD   2 puff at 01/20/17 0236  . benzonatate (TESSALON) capsule 100 mg  100 mg Oral TID PRN Ward, Kristen N, DO   100 mg at 01/20/17 0257  . guaiFENesin tablet 200 mg  200 mg Oral Q4H PRN Ward, Kristen N, DO   200 mg at 01/20/17 0257  . oseltamivir (TAMIFLU) capsule 75 mg  75 mg Oral BID Arby BarrettePfeiffer, Marcy, MD   75 mg at 01/21/17 1001   No current outpatient medications on file.    Musculoskeletal: Strength & Muscle Tone: within normal limits Gait & Station: normal Patient leans: N/A  Psychiatric Specialty Exam: Physical Exam  Nursing note and vitals reviewed. Constitutional: He is oriented to person, place, and time. He appears well-developed and well-nourished.  HENT:  Head: Normocephalic.  Neck: Normal range of motion.  Respiratory: Effort normal.  Musculoskeletal: Normal range of motion.  Neurological: He is alert and oriented to person, place, and time.  Psychiatric: His speech is normal and behavior is normal. Judgment and thought content normal. Cognition and memory are normal. He exhibits a depressed mood.    Review of Systems  Psychiatric/Behavioral: Positive for depression and substance abuse.  All other systems reviewed and are negative.   Blood pressure (!) 103/59, pulse 66, temperature 98 F (36.7 C), temperature source Oral, resp. rate 18, height 5' 10.5" (1.791 m), weight 90.7 kg (200 lb), SpO2 97 %.Body mass index is 28.29 kg/m.  General Appearance: Casual  Eye Contact:  Fair  Speech:  Normal Rate  Volume:  Normal  Mood:  Depressed, mild  Affect:  Congruent  Thought Process:  Coherent and Descriptions of Associations: Intact  Orientation:  Full (Time, Place, and Person)  Thought Content:  WDL, logical  Suicidal Thoughts:  No  Homicidal Thoughts:  No  Memory:  Immediate;   Good Recent;   Good Remote;   Good  Judgement:  Fair  Insight:  Good  Psychomotor Activity:  Normal   Concentration:  Concentration: Good and Attention Span: Good  Recall:  Good  Fund of Knowledge:  Fair  Language:  Good  Akathisia:  No  Handed:  Right  AIMS (if indicated):   N/A  Assets:  Leisure Time Physical Health Resilience Social Support  ADL's:  Intact  Cognition:  WNL  Sleep:   N/A     Treatment Plan Summary: Daily contact with patient to assess and evaluate symptoms and progress in treatment, Medication management and Plan cocaine abuse with cocaine induced mood disorder:  -Crisis stabilization -Medication management:  Medical medications continued -Individual and substance abuse counseling -Peer support referral  Disposition: No evidence of imminent risk to self or others at present.    Nanine MeansLORD, JAMISON, NP 01/21/2017 10:23 AM   Patient seen face-to-face for psychiatric evaluation, chart reviewed and case discussed with the physician extender and developed treatment plan. Reviewed the information documented and agree with the treatment plan.  Juanetta BeetsJacqueline Jimmi Sidener, DO

## 2017-01-21 NOTE — ED Notes (Signed)
No respiratory or acute distress noted resting in bed with eyes closed call light in reach no reaction to medication noted hospital sitter at bedside.

## 2017-01-21 NOTE — BH Assessment (Addendum)
[facility] Assessment Progress Note  Per Jacqueline Norman, DO, this pt does not require psychiatric hospitalization at this time, and is psychiatrically cleared.  Pt is to be provided with referral information for [facility] for follow up.  This has been included in pt's discharge instructions.  Pt's nurse has been notified.  Nemiah Kissner, MA Triage Specialist [phone]       

## 2017-01-21 NOTE — ED Provider Notes (Addendum)
Recheck pt  - awake and alert, no distress.  Patient states had been feeling stressed and depressed relating substance abuse, and new homelessness issues.  After 2+ days in ED he reports feeling improved. He currently does not voice suicidal thoughts/plan.  Patient appears to be thinking clearly, he is not psychotic.  Psychiatry is rounding on patient this AM, and will re-assess.  Patient also has the flu - from that standpoint, he does not need to be kept in the hospital, or in the ED.    Will ask SW to evaluate and provide community resources, including information on shelters and accessing social and behavioral health support services as outpatient.  If psychiatry feel is stable on their re-assessment this AM, anticipate possible d/c then.      Cathren LaineSteinl, Nadine Ryle, MD 01/21/17 [phone]  BH team/psychiatrist indicates is psychiatrically clear for d/c.       Cathren LaineSteinl, [place], MD 01/21/17 1057

## 2017-01-21 NOTE — Progress Notes (Signed)
CSW received call from ED Secretary Misty Stanley(Lisa) asking that homeless shelter resources as well as outpatient mental health resources be faxed over for pt. CSW has faxed over 6 pages of resources to [phone] at this time. At this time CSW was informed that Misty StanleyLisa gave resources to pt's RN. There are no further CSW needs. CSW signing off.     Claude MangesKierra S. Chevonne Bostrom, MSW, LCSW-A Emergency Department Clinical Social Worker [phone]

## 2017-01-21 NOTE — Discharge Instructions (Addendum)
It was our pleasure to provide your ER care today - we hope that you feel better.  Rest. Drink adequate fluids.  Use social resources and mental health resources provided.   Take tamiflu as prescribed.   Follow up with primary care doctor in 1 week.  For mental health issues and/or crisis, [facility].  Return to ER if worse, trouble breathing, other concern.    For your mental health needs, you are advised to follow up with [facility].  New and returning patients are seen at their walk-in clinic.  Walk-in hours are Monday - Friday from 8:00 am - 3:00 pm.  Walk-in patients are seen on a first come, first served basis.  Try to arrive as early as possible for he best chance of being seen the same day:       [facility]      [address]      [place], [place] [address]      [phone]

## 2017-01-21 NOTE — [facility] Suicide Risk Assessment (Signed)
Suicide Risk Assessment  Discharge Assessment   [facility]   Principal Problem: Cocaine abuse with cocaine-induced mood disorder [facility](HCC) Discharge Diagnoses:  Patient Active Problem List   Diagnosis Date Noted  . Cocaine abuse with cocaine-induced mood disorder [facility](HCC) [F14.14] 01/19/2017    Priority: High    Total Time spent with patient: 30 minutes  Musculoskeletal: Strength & Muscle Tone: within normal limits Gait & Station: normal Patient leans: N/A  Psychiatric Specialty Exam: Physical Exam  Constitutional: He is oriented to person, place, and time. He appears well-developed and well-nourished.  HENT:  Head: Normocephalic.  Neck: Normal range of motion.  Respiratory: Effort normal.  Musculoskeletal: Normal range of motion.  Neurological: He is alert and oriented to person, place, and time.  Psychiatric: His speech is normal and behavior is normal. Judgment and thought content normal. Cognition and memory are normal. He exhibits a depressed mood.    Review of Systems  Psychiatric/Behavioral: Positive for depression and substance abuse.  All other systems reviewed and are negative.   Blood pressure (!) 103/59, pulse 66, temperature 98 F (36.7 C), temperature source Oral, resp. rate 18, height 5' 10.5" (1.791 m), weight 90.7 kg (200 lb), SpO2 97 %.Body mass index is 28.29 kg/m.  General Appearance: Casual  Eye Contact:  Fair  Speech:  Normal Rate  Volume:  Normal  Mood:  Depressed, mild  Affect:  Congruent  Thought Process:  Coherent and Descriptions of Associations: Intact  Orientation:  Full (Time, Place, and Person)  Thought Content:  WDL, logical  Suicidal Thoughts:  No  Homicidal Thoughts:  No  Memory:  Immediate;   Good Recent;   Good Remote;   Good  Judgement:  Fair  Insight:  Good  Psychomotor Activity:  Normal  Concentration:  Concentration: Good and Attention Span: Good  Recall:  Good  Fund of Knowledge:  Fair  Language:   Good  Akathisia:  No  Handed:  Right  AIMS (if indicated):     Assets:  Leisure Time Physical Health Resilience Social Support  ADL's:  Intact  Cognition:  WNL  Sleep:      Mental Status Per Nursing Assessment::   On Admission:   cocaine abuse with suicidal ideations  Demographic Factors:  Male  Loss Factors: NA  Historical Factors: NA  Risk Reduction Factors:   Responsible for children under 55 years of age, Sense of responsibility to family and Positive social support  Continued Clinical Symptoms:  Depression, mild  Cognitive Features That Contribute To Risk:  None    Suicide Risk:  Minimal: No identifiable suicidal ideation.  Patients presenting with no risk factors but with morbid ruminations; may be classified as minimal risk based on the severity of the depressive symptoms    Plan Of Care/Follow-up recommendations:  Activity:  as tolerated Diet:  heart healthy diet  LORD, JAMISON, NP 01/21/2017, 5:17 PM

## 2019-05-09 LAB — HEMOGLOBIN A1C
Estimated Avg Glucose, External: 130 mg/dL — ABNORMAL HIGH (ref 91–123)
Hemoglobin A1C, External: 6.1 % — ABNORMAL HIGH (ref 4.8–5.6)

## 2019-08-12 LAB — HEMOGLOBIN A1C
Estimated Avg Glucose, External: 128 mg/dL — ABNORMAL HIGH (ref 91–123)
Hemoglobin A1C, External: 6.1 % — ABNORMAL HIGH (ref 4.8–5.6)

## 2019-08-16 ENCOUNTER — Encounter

## 2019-08-23 ENCOUNTER — Inpatient Hospital Stay: Payer: PRIVATE HEALTH INSURANCE | Attending: Family | Primary: Family

## 2019-09-18 ENCOUNTER — Ambulatory Visit
Admit: 2019-09-18 | Discharge: 2019-09-22 | Payer: PRIVATE HEALTH INSURANCE | Attending: Internal Medicine | Primary: Family Medicine

## 2019-09-18 ENCOUNTER — Ambulatory Visit: Attending: Internal Medicine | Primary: Family Medicine

## 2019-09-18 DIAGNOSIS — D473 Essential (hemorrhagic) thrombocythemia: Secondary | ICD-10-CM

## 2019-09-18 DIAGNOSIS — D75839 Thrombocytosis, unspecified: Secondary | ICD-10-CM

## 2019-09-18 NOTE — Progress Notes (Signed)
 James Hurst presents today for   Chief Complaint   Patient presents with   . New Patient       Is someone accompanying this pt? no    Is the patient using any DME equipment during OV? no    Coordination of Care:  1. Have you been to the ER, urgent care clinic since your last visit? Hospitalized since your last visit? no    2. Have you seen or consulted any other health care providers outside of the [facility] since your last visit? Include any pap smears or colon screening. no

## 2019-09-18 NOTE — Progress Notes (Signed)
Hematology/Oncology Consultation Note      Date: 09/18/2019    Name: James Hurst  DOB: [birth year]        Bunnie Domino, NP         Subjective:     Chief complaint: Thrombocytosis    History of Present Illness:   James Hurst is a most pleasant 59 y.o. year old male who was seen for consultation of Thrombocytosis.   The patient has a past medical history significant of Thrombocytosis diagnosed since 2015. He reported he has follow up with Hematologist at [facility] in 2015. He was then transferred to care to Hematologist, Dr. Sabra Heck in [place]. His last seen by Hematologist was in 2019.  He denied any bone marrow biopsy performed.   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 Presently he is taking Hydroxyurea 1500 mg daily for the past 4 months. He reported doing well with Hydroxyurea without any tolerating issues.   The patient reported doing well overall.  He otherwise has no other complaints. Denied fever, chills, night sweat, unintentional weight loss, skin lumps or bumps, acute bleeding or bruising issues. Denied headache, acute vision change, dizziness, chest pain, worsen shortness of breath, palpitation, productive cough, nausea, vomiting, abdominal pain, altered bowel habits, dysuria, worsen bone pain or back pain, new focal numbness or weakness.           Past Medical History, Family History, and Social History:    Past Medical History:   Diagnosis Date   ??? Hypertension      History reviewed. No pertinent surgical history.  Social History     Socioeconomic History   ??? Marital status: UNKNOWN     Spouse name: Not on file   ??? Number of children: Not on file   ??? Years of education: Not on file   ??? Highest education level: Not on file   Occupational History   ??? Not on file   Tobacco Use   ??? Smoking status: Never Smoker   ??? Smokeless tobacco: Never Used   Vaping Use   ??? Vaping Use: Never used   Substance and Sexual Activity   ??? Alcohol use: Not Currently   ???  Drug use: Never   ??? Sexual activity: Not on file   Other Topics Concern   ??? Not on file   Social History Narrative   ??? Not on file     Social Determinants of Health     Financial Resource Strain:    ??? Difficulty of Paying Living Expenses:    Food Insecurity:    ??? Worried About Charity fundraiser in the Last Year:    ??? Arboriculturist in the Last Year:    Transportation Needs:    ??? Film/video editor (Medical):    ??? Lack of Transportation (Non-Medical):    Physical Activity:    ??? Days of Exercise per Week:    ??? Minutes of Exercise per Session:    Stress:    ??? Feeling of Stress :    Social Connections:    ??? Frequency of Communication with Friends and Family:    ??? Frequency of Social Gatherings with Friends and Family:    ??? Attends Religious Services:    ??? Marine scientist or Organizations:    ??? Attends Archivist Meetings:    ??? Marital Status:    Intimate Partner Violence:    ??? Fear of  Current or Ex-Partner:    ??? Emotionally Abused:    ??? Physically Abused:    ??? Sexually Abused:      History reviewed. No pertinent family history.  Current Outpatient Medications   Medication Sig Dispense Refill   ??? hydroxyurea (HYDREA) 500 mg capsule TAKE 3 CAPSULES BY MOUTH ONCE DAILY FOR 90 DAYS     ??? cyclobenzaprine (FLEXERIL) 10 mg tablet TAKE 1 TABLET BY MOUTH EVERY 8 HOURS AS NEEDED FOR MUSCLE SPASM     ??? atorvastatin (LIPITOR) 20 mg tablet TAKE 1 TABLET BY MOUTH ONCE DAILY FOR 90 DAYS     ??? hydroCHLOROthiazide (HYDRODIURIL) 25 mg tablet TAKE 1 TABLET BY MOUTH ONCE DAILY IN THE MORNING FOR 90 DAYS     ??? omeprazole (PRILOSEC) 20 mg capsule TAKE 1 CAPSULE BY MOUTH DAILY 30 MINUTES BEFORE MORNING MEAL         Review of Systems   Constitutional: Negative for chills, diaphoresis, fever, malaise/fatigue and weight loss.   Respiratory: Negative for cough, hemoptysis, shortness of breath and wheezing.    Cardiovascular: Negative for chest pain, palpitations and leg swelling.   Gastrointestinal: Negative for abdominal  pain, diarrhea, heartburn, nausea and vomiting.   Genitourinary: Negative for dysuria, frequency, hematuria and urgency.   Musculoskeletal: Negative for joint pain and myalgias.   Skin: Negative for itching and rash.   Neurological: Negative for dizziness, seizures, weakness and headaches.   Psychiatric/Behavioral: Negative for depression. The patient does not have insomnia.             Objective:     Visit Vitals  BP (!) 142/92 (BP 1 Location: Left upper arm, BP Patient Position: Sitting)   Pulse 74   Temp 97.8 ??F (36.6 ??C) (Temporal)   Resp 16   Ht 5' 10.5" (1.791 m)   Wt 83.2 kg (183 lb 6.4 oz)   SpO2 99%   BMI 25.94 kg/m??       ECOG Performance Status (grade): 0  0 - able to carry on all pre-disease activity w/out restriction  1 - restricted but able to carry out light work  2 - ambulatory and can self- care but unable to carry out work  3 - bed or chair >50% of waking hours  4 - completely disable, total care, confined to bed or chair    Physical Exam  Constitutional:       General: He is not in acute distress.  HENT:      Head: Normocephalic and atraumatic.   Eyes:      Pupils: Pupils are equal, round, and reactive to light.   Cardiovascular:      Pulses: Normal pulses.      Heart sounds: Normal heart sounds. No murmur heard.     Pulmonary:      Effort: Pulmonary effort is normal. No respiratory distress.      Breath sounds: Normal breath sounds.   Abdominal:      General: Bowel sounds are normal. There is no distension.      Palpations: Abdomen is soft. There is no mass.      Tenderness: There is no abdominal tenderness. There is no guarding.   Musculoskeletal:         General: No swelling or tenderness.      Cervical back: Neck supple. No rigidity.   Lymphadenopathy:      Cervical: No cervical adenopathy.   Skin:     General: Skin is warm.  Findings: No rash.   Neurological:      Mental Status: He is alert and oriented to person, place, and time. Mental status is at baseline.      Cranial Nerves: No  cranial nerve deficit.   Psychiatric:         Mood and Affect: Mood normal.          Diagnostics:      No results found for this or any previous visit (from the past 96 hour(s)).    Imaging:  No results found for this or any previous visit.      No results found for this or any previous visit.      No results found for this or any previous visit.        Pathology          Assessment:                                        1. Thrombocytosis ([place])        Plan:                                        # Thrombocytosis   -- The patient has a past medical history significant of Thrombocytosis diagnosed since 2015. He reported he has follow up with Hematologist at [facility] in 2015. He was then transferred to care of Hematologist, Dr. Sabra Heck in [place]. His last seen by Hematologist was in 2019.  He denied any bone marrow biopsy performed in the past.   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  -- 08/11/2019 CBC reported hemoglobin 13.7, hematocrit 41.9%, MCV 98, WBC 5.2, platelets 608,000.  -- Presently he is taking Hydroxyurea 1500 mg daily for the past 4 months. He reported doing well with Hydroxyurea without any tolerating issues.     Plan:  -- Lab today: CBC, chemistry, iron profiles, ferritin, JAK2, peripheral smear. Further workup will be guided by results from aforementioned initial study.   -- Patient will continue hydroxyurea at current dose of 1500 mg daily at this time.  -- We we will try to obtain his old medical records from Dr. Sabra Heck, [place].  -- I will see the patient back in about 3-4 weeks. Always sooner if required.      Orders Placed This Encounter   ??? METABOLIC PANEL, COMPREHENSIVE     Standing Status:   Future     Standing Expiration Date:   09/18/2020   ??? CBC WITH AUTOMATED DIFF     Standing Status:   Future     Standing Expiration Date:   09/18/2020   ??? PERIPHERAL SMEAR     Standing Status:   Future     Standing Expiration Date:    09/17/2020   ??? JAK2 MUTATION ANALYSIS     Standing Status:   Future     Standing Expiration Date:   09/17/2020   ??? IRON PROFILE     Standing Status:   Future     Standing Expiration Date:   09/18/2020   ??? FERRITIN     Standing Status:   Future     Standing Expiration Date:   09/17/2020   ??? hydroxyurea (HYDREA) 500 mg capsule  Sig: TAKE 3 CAPSULES BY MOUTH ONCE DAILY FOR 90 DAYS   ??? cyclobenzaprine (FLEXERIL) 10 mg tablet     Sig: TAKE 1 TABLET BY MOUTH EVERY 8 HOURS AS NEEDED FOR MUSCLE SPASM   ??? atorvastatin (LIPITOR) 20 mg tablet     Sig: TAKE 1 TABLET BY MOUTH ONCE DAILY FOR 90 DAYS   ??? hydroCHLOROthiazide (HYDRODIURIL) 25 mg tablet     Sig: TAKE 1 TABLET BY MOUTH ONCE DAILY IN THE MORNING FOR 90 DAYS   ??? omeprazole (PRILOSEC) 20 mg capsule     Sig: TAKE 1 CAPSULE BY MOUTH DAILY 30 MINUTES BEFORE MORNING MEAL           James Hurst has a reminder for a "due or due soon" health maintenance. I have asked that he contact his primary care provider for follow-up on this health maintenance.   All of patient's questions answered to their apparent satisfaction. They verbally show understanding and agreement with aforementioned plan.       Flossie Buffy, MD  09/18/2019          Parts of this document has been produced using Dragon dictation system.  Unrecognized errors in transcription may be present. Please Kiko Ripp not hesitate to reach out for any questions or clarifications.      CC: Bunnie Domino, NP

## 2019-09-27 LAB — CBC WITH AUTOMATED DIFF
ABS. BASOPHILS: 40 cells/uL (ref 0–200)
ABS. EOSINOPHILS: 22 cells/uL (ref 15–500)
ABS. LYMPHOCYTES: 1060 cells/uL (ref 850–3900)
ABS. MONOCYTES: 326 cells/uL (ref 200–950)
ABS. NEUTROPHILS: 2952 cells/uL (ref 1500–7800)
BASOPHILS: 0.9 %
EOSINOPHILS: 0.5 %
HCT: 37.9 % — ABNORMAL LOW (ref 38.5–50.0)
HGB: 12.9 g/dL — ABNORMAL LOW (ref 13.2–17.1)
LYMPHOCYTES: 24.1 %
MCH: 36.1 pg — ABNORMAL HIGH (ref 27.0–33.0)
MCHC: 34 g/dL (ref 32.0–36.0)
MCV: 106.2 fL — ABNORMAL HIGH (ref 80.0–100.0)
MEAN PLATELET VOLUME: 9.2 fL (ref 7.5–12.5)
MONOCYTES: 7.4 %
Neutrophils: 67.1 %
PLATELET: 523 10*3/uL — ABNORMAL HIGH (ref 140–400)
RBC: 3.57 10*6/uL — ABNORMAL LOW (ref 4.20–5.80)
RDW: 18.9 % — ABNORMAL HIGH (ref 11.0–15.0)
WBC: 4.4 10*3/uL (ref 3.8–10.8)

## 2019-09-27 LAB — JAK2 V617F MUTATION ANALYSIS
JAK2 V617F MUTATION: NOT DETECTED
JAK2 V617F Mutation: NOT DETECTED

## 2019-09-27 LAB — METABOLIC PANEL, COMPREHENSIVE
ALB/GLOBRATIO: 1.8 (calc) (ref 1.0–2.5)
ALT (SGPT): 14 U/L (ref 9–46)
AST (SGOT): 23 U/L (ref 10–35)
Albumin: 4.5 g/dL (ref 3.6–5.1)
Alkaline Phosphatase, total: 63 U/L (ref 35–144)
BUN: 13 mg/dL (ref 7–25)
Bilirubin, total: 1.2 mg/dL (ref 0.2–1.2)
CO2: 30 mmol/L (ref 20–32)
Calcium: 9.9 mg/dL (ref 8.6–10.3)
Chloride: 101 mmol/L (ref 98–110)
Creatinine: 0.98 mg/dL (ref 0.70–1.33)
GFR est AA: 98 mL/min/{1.73_m2} (ref >=60)
GFR est non-AA: 85 mL/min/{1.73_m2} (ref >=60)
Globulin: 2.5 g/dL (calc) (ref 1.9–3.7)
Potassium: 4.8 mmol/L (ref 3.5–5.3)
Protein, total: 7 g/dL (ref 6.1–8.1)
Sodium: 142 mmol/L (ref 135–146)

## 2019-09-27 LAB — SPECIMEN COMPROMISED

## 2019-09-27 LAB — IRON PROFILE
% SATURATION: 21 % (calc) (ref 20–48)
Iron binding capacity: 383 mcg/dL (calc) (ref 250–425)
Iron: 82 ug/dL (ref 50–180)

## 2019-09-27 LAB — PATHOLOGIST REVIEW SMEARS

## 2019-09-27 LAB — FERRITIN
Ferritin: 32 ng/mL — ABNORMAL LOW (ref 38–380)
Ferritin: 32 ng/mL — ABNORMAL LOW (ref 38–380)

## 2019-09-27 LAB — CBC WITH AUTO DIFFERENTIAL
Basophils %: 0.9 %
Basophils Absolute: 40 cells/uL (ref 0–200)
Eosinophils %: 0.5 %
Eosinophils Absolute: 22 cells/uL (ref 15–500)
Hematocrit: 37.9 % — ABNORMAL LOW (ref 38.5–50.0)
Hemoglobin: 12.9 g/dL — ABNORMAL LOW (ref 13.2–17.1)
Lymphocytes %: 24.1 %
Lymphocytes Absolute: 1060 cells/uL (ref 850–3900)
MCH: 36.1 pg — ABNORMAL HIGH (ref 27.0–33.0)
MCHC: 34 g/dL (ref 32.0–36.0)
MCV: 106.2 fL — ABNORMAL HIGH (ref 80.0–100.0)
MPV: 9.2 fL (ref 7.5–12.5)
Monocytes %: 7.4 %
Monocytes Absolute: 326 cells/uL (ref 200–950)
Neutrophils %: 67.1 %
Neutrophils Absolute: 2952 cells/uL (ref 1500–7800)
Platelets: 523 10*3/uL — ABNORMAL HIGH (ref 140–400)
RBC: 3.57 10*6/uL — ABNORMAL LOW (ref 4.20–5.80)
RDW: 18.9 % — ABNORMAL HIGH (ref 11.0–15.0)
WBC: 4.4 10*3/uL (ref 3.8–10.8)

## 2019-09-27 LAB — COMPREHENSIVE METABOLIC PANEL
ALT: 14 U/L (ref 9–46)
AST: 23 U/L (ref 10–35)
Albumin/Globulin Ratio: 1.8 (calc) (ref 1.0–2.5)
Albumin: 4.5 g/dL (ref 3.6–5.1)
Alkaline Phosphatase: 63 U/L (ref 35–144)
BUN: 13 mg/dL (ref 7–25)
CO2: 30 mmol/L (ref 20–32)
Calcium: 9.9 mg/dL (ref 8.6–10.3)
Chloride: 101 mmol/L (ref 98–110)
Creatinine: 0.98 mg/dL (ref 0.70–1.33)
EGFR IF NonAfrican American: 85 mL/min/{1.73_m2} (ref >=60)
GFR African American: 98 mL/min/{1.73_m2} (ref >=60)
Globulin: 2.5 g/dL (calc) (ref 1.9–3.7)
Potassium: 4.8 mmol/L (ref 3.5–5.3)
Sodium: 142 mmol/L (ref 135–146)
Total Bilirubin: 1.2 mg/dL (ref 0.2–1.2)
Total Protein: 7 g/dL (ref 6.1–8.1)

## 2019-09-27 LAB — IRON AND TIBC
% SATURATION: 21 % (calc) (ref 20–48)
IRON BINDING CAPACITY, IBCT: 383 mcg/dL (calc) (ref 250–425)
Iron: 82 ug/dL (ref 50–180)

## 2019-10-10 ENCOUNTER — Telehealth
Admit: 2019-10-10 | Discharge: 2019-10-10 | Payer: PRIVATE HEALTH INSURANCE | Attending: Internal Medicine | Primary: Family

## 2019-10-10 ENCOUNTER — Telehealth: Attending: Internal Medicine | Primary: Family Medicine

## 2019-10-10 DIAGNOSIS — D473 Essential (hemorrhagic) thrombocythemia: Secondary | ICD-10-CM

## 2019-10-10 DIAGNOSIS — D75839 Thrombocytosis, unspecified: Secondary | ICD-10-CM

## 2019-10-10 MED ORDER — HYDROXYUREA 500 MG CAPSULE
500 mg | ORAL_CAPSULE | ORAL | 1 refills | Status: DC
Start: 2019-10-10 — End: 2019-12-06

## 2019-10-10 MED ORDER — FERROUS SULFATE 325 MG (65 MG ELEMENTAL IRON) TAB
325 mg (65 mg iron) | ORAL_TABLET | Freq: Three times a day (TID) | ORAL | 5 refills | Status: DC
Start: 2019-10-10 — End: 2019-12-27

## 2019-10-10 NOTE — Progress Notes (Signed)
James Hurst is a 59 y.o. male, evaluated via audio-only technology on 10/10/2019 for Thrombocytosis.    Assessment & Plan:   Thrombocytosis   Iron deficiency anemia  -- The patient has a past medical history significant of Thrombocytosis diagnosed since 2015. He reported he has follow up with Hematologist at [facility] in 2015. He was then transferred to care of Hematologist, Dr. Sabra Heck in [place]. His last seen by Hematologist was in 2019. He denied any bone marrow biopsy performed in the past.   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  -- 08/11/2019 CBC reported hemoglobin 13.7, hematocrit 41.9%, MCV 98, WBC 5.2, platelets 608,000.  -- Presently he is taking Hydroxyurea 1500 mg daily for the past 4 months. He reported doing well with Hydroxyurea without any tolerating issues.   -- Today I have reviewed with the patient about recent lab reports.   09/18/2019 CBC reported hemoglobin 12.9, hematocrit 37.9%, WBC 4.4, platelet 523,000.  Negative JAK2 mutation.  Iron saturation 21%, ferritin 32.  Peripheral smear review reported macrocytic anemia and borderline thrombocytosis.  -- It was still unclear etiology of his thrombocytosis, possible ET, also contributed by iron deficiency anemia. The patient reported he has been on hydroxyurea since 2015 for possible ET. No available chart from previous hematologist for review at this time. He would like to continue Hydroxyurea as previous plan. He states he ran our of medications and asked for refill.    Plan:  -- Patient will continue hydroxyurea at current dose of 1500 mg daily at this time. Refill Hydroxyurea today.  -- Iron replacement. Iron prescriptions will be provided.   -- Lab check prior to next visit: CBC, chemistry, iron profiles, ferritin, CARL/MPL/BCRABL1. Orders placed.  -- We we will try to obtain his old medical records from Dr. Sabra Heck, [place].  -- I have advised the patient to follow up PCP/GI  for colonoscopy as indicated.  -- I will see the patient back in about 3 months. Always sooner if required.      12  Subjective:   Mr. James Hurst is a most pleasant 59 y.o. year old male who was seen for consultation of Thrombocytosis.   The patient has a past medical history significant of Thrombocytosis diagnosed since 2015. He reported he has follow up with Hematologist at [facility] in 2015. He was then transferred to care to Hematologist, Dr. Sabra Heck in [place]. His last seen by Hematologist was in 2019.  He denied any bone marrow biopsy performed.   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 Presently he is taking Hydroxyurea 1500 mg daily for the past 4-5 months. He reported doing well with Hydroxyurea without any tolerating issues.   Today he reported doing well overall.  He has no other complaints. Denied fever, chills, night sweat, unintentional weight loss, skin lumps or bumps, acute bleeding or bruising issues. Denied headache, acute vision change, dizziness, chest pain, worsen shortness of breath, palpitation, productive cough, nausea, vomiting, abdominal pain, altered bowel habits, dysuria, worsen bone pain or back pain, new focal numbness or weakness.    Prior to Admission medications    Medication Sig Start Date End Date Taking? Authorizing Provider   hydroxyurea (HYDREA) 500 mg capsule TAKE 3 CAPSULES BY MOUTH ONCE DAILY FOR 90 DAYS 09/09/19   Provider, Historical   cyclobenzaprine (FLEXERIL) 10 mg tablet TAKE 1 TABLET BY MOUTH EVERY 8 HOURS AS NEEDED FOR MUSCLE  SPASM 08/11/19   Provider, Historical   atorvastatin (LIPITOR) 20 mg tablet TAKE 1 TABLET BY MOUTH ONCE DAILY FOR 90 DAYS 08/11/19   Provider, Historical   hydroCHLOROthiazide (HYDRODIURIL) 25 mg tablet TAKE 1 TABLET BY MOUTH ONCE DAILY IN THE MORNING FOR 90 DAYS 08/11/19   Provider, Historical   omeprazole (PRILOSEC) 20 mg capsule TAKE 1 CAPSULE BY MOUTH DAILY 30 MINUTES BEFORE MORNING MEAL 08/11/19    Provider, Historical         Review of Systems   Constitutional: Negative for chills, diaphoresis, fever, malaise/fatigue and weight loss.   Respiratory: Negative for cough, hemoptysis, shortness of breath and wheezing.    Cardiovascular: Negative for chest pain, palpitations and leg swelling.   Gastrointestinal: Negative for abdominal pain, diarrhea, heartburn, nausea and vomiting.   Genitourinary: Negative for dysuria, frequency, hematuria and urgency.   Musculoskeletal: Negative for joint pain and myalgias.   Skin: Negative for itching and rash.   Neurological: Negative for dizziness, seizures, weakness and headaches.   Psychiatric/Behavioral: Negative for depression. The patient does not have insomnia.        No flowsheet data found.    James Hurst, who was evaluated through a patient-initiated, synchronous (real-time) audio only encounter, and/or her healthcare decision maker, is aware that it is a billable service, with coverage as determined by his insurance carrier. He provided verbal consent to proceed: Yes. He has not had a related appointment within my department in the past 7 days or scheduled within the next 24 hours.    On this date 10/10/2019 I have spent 20 minutes reviewing previous notes, test results and face to face (virtual) with the patient discussing the diagnosis and importance of compliance with the treatment plan as well as documenting on the day of the visit.    Flossie Buffy, MD

## 2019-12-06 ENCOUNTER — Encounter

## 2019-12-06 NOTE — Progress Notes (Signed)
Error, refill already in chart

## 2019-12-07 MED ORDER — HYDROXYUREA 500 MG CAPSULE
500 mg | ORAL_CAPSULE | ORAL | 1 refills | Status: DC
Start: 2019-12-07 — End: 2020-02-21

## 2019-12-27 ENCOUNTER — Other Ambulatory Visit: Admit: 2019-12-27 | Discharge: 2019-12-27 | Payer: PRIVATE HEALTH INSURANCE | Primary: Family

## 2019-12-27 DIAGNOSIS — D75839 Thrombocytosis, unspecified: Secondary | ICD-10-CM

## 2019-12-27 MED ORDER — FERROUS SULFATE 325 MG (65 MG ELEMENTAL IRON) TAB
325 mg (65 mg iron) | ORAL_TABLET | Freq: Three times a day (TID) | ORAL | 5 refills | Status: DC
Start: 2019-12-27 — End: 2020-07-31

## 2019-12-29 LAB — IRON, TIBC AND FERRITIN PANEL
% SATURATION: 20 % (calc) (ref 20–48)
% SATURATION: 20 % (calc) (ref 20–48)
Ferritin: 59 ng/mL (ref 38–380)
Ferritin: 59 ng/mL (ref 38–380)
IRON BINDING CAPACITY, IBCT: 346 mcg/dL (calc) (ref 250–425)
Iron binding capacity: 346 mcg/dL (calc) (ref 250–425)
Iron: 69 ug/dL (ref 50–180)
Iron: 69 ug/dL (ref 50–180)

## 2019-12-29 LAB — CBC WITH AUTOMATED DIFF
ABS. BASOPHILS: 19 cells/uL (ref 0–200)
ABS. EOSINOPHILS: 19 cells/uL (ref 15–500)
ABS. LYMPHOCYTES: 1344 cells/uL (ref 850–3900)
ABS. MONOCYTES: 483 cells/uL (ref 200–950)
ABS. NEUTROPHILS: 1334 cells/uL — ABNORMAL LOW (ref 1500–7800)
BASOPHILS: 0.6 %
EOSINOPHILS: 0.6 %
HCT: 35.5 % — ABNORMAL LOW (ref 38.5–50.0)
HGB: 12.8 g/dL — ABNORMAL LOW (ref 13.2–17.1)
LYMPHOCYTES: 42 %
MCH: 40.9 pg — ABNORMAL HIGH (ref 27.0–33.0)
MCHC: 36.1 g/dL — ABNORMAL HIGH (ref 32.0–36.0)
MCV: 113.4 fL — ABNORMAL HIGH (ref 80.0–100.0)
MEAN PLATELET VOLUME: 10 fL (ref 7.5–12.5)
MONOCYTES: 15.1 %
Neutrophils: 41.7 %
PLATELET: 522 10*3/uL — ABNORMAL HIGH (ref 140–400)
RBC: 3.13 10*6/uL — ABNORMAL LOW (ref 4.20–5.80)
RDW: 16.4 % — ABNORMAL HIGH (ref 11.0–15.0)
WBC: 3.2 10*3/uL — ABNORMAL LOW (ref 3.8–10.8)

## 2019-12-29 LAB — METABOLIC PANEL, COMPREHENSIVE
ALB/GLOBRATIO: 2 (calc) (ref 1.0–2.5)
ALT (SGPT): 14 U/L (ref 9–46)
AST (SGOT): 19 U/L (ref 10–35)
Albumin: 4.5 g/dL (ref 3.6–5.1)
Alkaline Phosphatase, total: 59 U/L (ref 35–144)
BUN: 16 mg/dL (ref 7–25)
Bilirubin, total: 1.3 mg/dL — ABNORMAL HIGH (ref 0.2–1.2)
CO2: 28 mmol/L (ref 20–32)
Calcium: 9.4 mg/dL (ref 8.6–10.3)
Chloride: 104 mmol/L (ref 98–110)
Creatinine: 1.05 mg/dL (ref 0.70–1.33)
GFR est AA: 90 mL/min/{1.73_m2} (ref >=60)
GFR est non-AA: 78 mL/min/{1.73_m2} (ref >=60)
Globulin: 2.2 g/dL (calc) (ref 1.9–3.7)
Glucose: 92 mg/dL (ref 65–99)
Potassium: 4.4 mmol/L (ref 3.5–5.3)
Protein, total: 6.7 g/dL (ref 6.1–8.1)
Sodium: 141 mmol/L (ref 135–146)

## 2019-12-29 LAB — COMPREHENSIVE METABOLIC PANEL
ALT: 14 U/L (ref 9–46)
AST: 19 U/L (ref 10–35)
Albumin/Globulin Ratio: 2 (calc) (ref 1.0–2.5)
Albumin: 4.5 g/dL (ref 3.6–5.1)
Alkaline Phosphatase: 59 U/L (ref 35–144)
BUN: 16 mg/dL (ref 7–25)
CO2: 28 mmol/L (ref 20–32)
Calcium: 9.4 mg/dL (ref 8.6–10.3)
Chloride: 104 mmol/L (ref 98–110)
Creatinine: 1.05 mg/dL (ref 0.70–1.33)
EGFR IF NonAfrican American: 78 mL/min/{1.73_m2} (ref >=60)
GFR African American: 90 mL/min/{1.73_m2} (ref >=60)
Globulin: 2.2 g/dL (calc) (ref 1.9–3.7)
Glucose: 92 mg/dL (ref 65–99)
Potassium: 4.4 mmol/L (ref 3.5–5.3)
Sodium: 141 mmol/L (ref 135–146)
Total Bilirubin: 1.3 mg/dL — ABNORMAL HIGH (ref 0.2–1.2)
Total Protein: 6.7 g/dL (ref 6.1–8.1)

## 2019-12-29 LAB — CBC WITH AUTO DIFFERENTIAL
Basophils %: 0.6 %
Basophils Absolute: 19 cells/uL (ref 0–200)
Eosinophils %: 0.6 %
Eosinophils Absolute: 19 cells/uL (ref 15–500)
Hematocrit: 35.5 % — ABNORMAL LOW (ref 38.5–50.0)
Hemoglobin: 12.8 g/dL — ABNORMAL LOW (ref 13.2–17.1)
Lymphocytes %: 42 %
Lymphocytes Absolute: 1344 cells/uL (ref 850–3900)
MCH: 40.9 pg — ABNORMAL HIGH (ref 27.0–33.0)
MCHC: 36.1 g/dL — ABNORMAL HIGH (ref 32.0–36.0)
MCV: 113.4 fL — ABNORMAL HIGH (ref 80.0–100.0)
MPV: 10 fL (ref 7.5–12.5)
Monocytes %: 15.1 %
Monocytes Absolute: 483 cells/uL (ref 200–950)
Neutrophils %: 41.7 %
Neutrophils Absolute: 1334 cells/uL — ABNORMAL LOW (ref 1500–7800)
Platelets: 522 10*3/uL — ABNORMAL HIGH (ref 140–400)
RBC: 3.13 10*6/uL — ABNORMAL LOW (ref 4.20–5.80)
RDW: 16.4 % — ABNORMAL HIGH (ref 11.0–15.0)
WBC: 3.2 10*3/uL — ABNORMAL LOW (ref 3.8–10.8)

## 2020-01-10 ENCOUNTER — Telehealth: Admit: 2020-01-10 | Discharge: 2020-01-10 | Payer: PRIVATE HEALTH INSURANCE | Attending: Family | Primary: Family

## 2020-01-10 ENCOUNTER — Telehealth: Attending: Family | Primary: Family Medicine

## 2020-01-10 DIAGNOSIS — D509 Iron deficiency anemia, unspecified: Secondary | ICD-10-CM

## 2020-01-10 NOTE — Progress Notes (Signed)
James Hurst is a 59 y.o. male, evaluated via audio-only technology on 01/10/2020 Thrombocytosis and Anemia.    PCP: Bunnie Domino, NP    Assessment & Plan:   Thrombocytosis and Iron Deficiency  --Patient has a Past Medical History significant of Thrombocytosis diagnosed since 2015.   --Negative JAK2 Mutation  --Peripheral Smear review showed Macrocytic Anemia and Borderline Thrombocytosis  --Thrombocytosis etiology still unclear, possible ET also contributed by Iron Deficiency Anemia. No available chart from previous Hematologist  --He would like to continue Hydroxyurea   --12/27/2019: WBC 3.2K/uL, hemoglobin 12.8g/dL with hematocrit 35.5%. Platelet 522. BUN and Creatinine were both normal. TSAT 20%. Ferritin 59.   --Patient was advised to decrease Hydroxyurea from 1500mg  PO daily  to 1,000mg  PO daily.  --He was also advised to take Oral iron supplementation with a sip of orange juice before breakfast. Patient admits not taking his iron as recommended. He states "its been a while sine I took my iron pills". He verbalized understanding.  --Patient agreed with plan  --Follow up in 3 months with repeat labs or sooner if indicated  --I spent 20 minutes on this visit with this established patient.      Subjective:   James Hurst is a 58 y.o. male who was evaluated via audio-only technology. Today he states he is doing well with no significant issues t report. He states he is currently taking Hydroxyurea 1500mg  PO daily and tolerating this well. Patient also admits not taking his oral iron as recommended. He reports "its been a while since I took my Iron pills". He denies any fevers, chills, recurrent infections, and skin rash. He denies any shortness or breath, chest pains, dizziness, weakness, fatigue, and palpitations. He denies any abdominal pain, nausea, vomiting, diarrhea, and constipation. He denies blood in the urine or stools. He denies dysuria, cough, hoarseness, pale skin, pinpoint red spots on  skin, sore throat, leg ulcers, sores, or white spots in mouth, unusual bleeding or bruising. He denies any pain or discomfort. He does not have any concerns or complaints to report at this time.    Prior to Admission medications    Medication Sig Start Date End Date Taking? Authorizing Provider   ferrous sulfate 325 mg (65 mg iron) tablet Take 1 Tablet by mouth three (3) times daily. 12/27/19   Bunnie Pion, NP   hydroxyurea (HYDREA) 500 mg capsule TAKE 3 CAPSULES BY MOUTH ONCE DAILY FOR 90 DAYS 12/07/19   Otoo, Cecilia A, NP   cyclobenzaprine (FLEXERIL) 10 mg tablet TAKE 1 TABLET BY MOUTH EVERY 8 HOURS AS NEEDED FOR MUSCLE SPASM 08/11/19   Provider, Historical   atorvastatin (LIPITOR) 20 mg tablet TAKE 1 TABLET BY MOUTH ONCE DAILY FOR 90 DAYS 08/11/19   Provider, Historical   hydroCHLOROthiazide (HYDRODIURIL) 25 mg tablet TAKE 1 TABLET BY MOUTH ONCE DAILY IN THE MORNING FOR 90 DAYS 08/11/19   Provider, Historical   omeprazole (PRILOSEC) 20 mg capsule TAKE 1 CAPSULE BY MOUTH DAILY 30 MINUTES BEFORE MORNING MEAL 08/11/19   Provider, Historical         Review of Systems   All other systems reviewed and are negative.      Patient-Reported Vitals 01/10/2020   Patient-Reported Weight 187lb     LABS:  Lab Results   Component Value Date/Time    WBC 3.2 (L) 12/27/2019 09:17 AM    HGB 12.8 (L) 12/27/2019 09:17 AM    HCT 35.5 (L) 12/27/2019 09:17 AM    PLATELET 522 (  H) 12/27/2019 09:17 AM    MCV 113.4 (H) 12/27/2019 09:17 AM     Lab Results   Component Value Date/Time    Sodium 141 12/27/2019 09:17 AM    Potassium 4.4 12/27/2019 09:17 AM    Chloride 104 12/27/2019 09:17 AM    CO2 28 12/27/2019 09:17 AM    Glucose 92 12/27/2019 09:17 AM    BUN 16 12/27/2019 09:17 AM    Creatinine 1.05 12/27/2019 09:17 AM    BUN/Creatinine ratio NOT APPLICABLE 19/14/7829 56:21 AM    GFR est AA 90 12/27/2019 09:17 AM    GFR est non-AA 78 12/27/2019 09:17 AM    Calcium 9.4 12/27/2019 09:17 AM    Bilirubin, total 1.3 (H) 12/27/2019 09:17 AM    Protein,  total 6.7 12/27/2019 09:17 AM    Albumin 4.5 12/27/2019 09:17 AM    Globulin 2.2 12/27/2019 09:17 AM    ALT (SGPT) 14 12/27/2019 09:17 AM    AST (SGOT) 19 12/27/2019 09:17 AM     Lab Results   Component Value Date/Time    Iron 69 12/27/2019 09:17 AM    Iron binding capacity 346 12/27/2019 09:17 AM    Ferritin 59 12/27/2019 09:17 AM       Eda Keys, who was evaluated through a patient-initiated, synchronous (real-time) audio only encounter, and/or her healthcare decision maker, is aware that it is a billable service, with coverage as determined by his insurance carrier. He provided verbal consent to proceed: Yes. He has not had a related appointment within my department in the past 7 days or scheduled within the next 24 hours.      Orders Placed This Encounter   ??? CBC WITH AUTOMATED DIFF     Standing Status:   Future     Standing Expiration Date:   01/10/2021   ??? FERRITIN     Standing Status:   Future     Standing Expiration Date:   01/10/2021   ??? IRON PROFILE     Standing Status:   Future     Standing Expiration Date:   30/09/6576   ??? METABOLIC PANEL, COMPREHENSIVE     Standing Status:   Future     Standing Expiration Date:   01/10/2021         Hilliard Clark, DNP, [place]  01/10/20      CC: Bunnie Domino, NP

## 2020-02-21 ENCOUNTER — Encounter

## 2020-02-22 MED ORDER — HYDROXYUREA 500 MG CAPSULE
500 mg | ORAL_CAPSULE | ORAL | 1 refills | Status: DC
Start: 2020-02-22 — End: 2020-06-07

## 2020-04-02 ENCOUNTER — Ambulatory Visit: Payer: PRIVATE HEALTH INSURANCE | Primary: Family Medicine

## 2020-04-04 ENCOUNTER — Ambulatory Visit: Admit: 2020-04-04 | Discharge: 2020-04-05 | Payer: PRIVATE HEALTH INSURANCE | Primary: Family Medicine

## 2020-04-04 NOTE — Progress Notes (Signed)
Patient has upcoming appt tomorrow with Dr Lazaro Arms.

## 2020-04-05 LAB — CBC WITH AUTOMATED DIFF
ABS. BASOPHILS: 62 cells/uL (ref 0–200)
ABS. EOSINOPHILS: 62 cells/uL (ref 15–500)
ABS. LYMPHOCYTES: 1702 cells/uL (ref 850–3900)
ABS. MONOCYTES: 498 cells/uL (ref 200–950)
ABS. NEUTROPHILS: 3276 cells/uL (ref 1500–7800)
BASOPHILS: 1.1 %
EOSINOPHILS: 1.1 %
HCT: 38.7 % (ref 38.5–50.0)
HGB: 13.8 g/dL (ref 13.2–17.1)
LYMPHOCYTES: 30.4 %
MCH: 40 pg — ABNORMAL HIGH (ref 27.0–33.0)
MCHC: 35.7 g/dL (ref 32.0–36.0)
MCV: 112.2 fL — ABNORMAL HIGH (ref 80.0–100.0)
MEAN PLATELET VOLUME: 9.6 fL (ref 7.5–12.5)
MONOCYTES: 8.9 %
Neutrophils: 58.5 %
PLATELET: 784 10*3/uL — ABNORMAL HIGH (ref 140–400)
RBC: 3.45 10*6/uL — ABNORMAL LOW (ref 4.20–5.80)
RDW: 11.8 % (ref 11.0–15.0)
WBC: 5.6 10*3/uL (ref 3.8–10.8)

## 2020-04-05 LAB — METABOLIC PANEL, COMPREHENSIVE
ALB/GLOBRATIO: 1.9 (calc) (ref 1.0–2.5)
ALT (SGPT): 16 U/L (ref 9–46)
AST (SGOT): 23 U/L (ref 10–35)
Albumin: 4.9 g/dL (ref 3.6–5.1)
Alkaline Phosphatase, total: 64 U/L (ref 35–144)
BUN: 15 mg/dL (ref 7–25)
Bilirubin, total: 1.5 mg/dL — ABNORMAL HIGH (ref 0.2–1.2)
CO2: 31 mmol/L (ref 20–32)
Calcium: 10.4 mg/dL — ABNORMAL HIGH (ref 8.6–10.3)
Chloride: 103 mmol/L (ref 98–110)
Creatinine: 1.02 mg/dL (ref 0.70–1.33)
GFR est AA: 93 mL/min/{1.73_m2} (ref >=60)
GFR est non-AA: 81 mL/min/{1.73_m2} (ref >=60)
Globulin: 2.6 g/dL (calc) (ref 1.9–3.7)
Glucose: 124 mg/dL — ABNORMAL HIGH (ref 65–99)
Potassium: 5.3 mmol/L (ref 3.5–5.3)
Protein, total: 7.5 g/dL (ref 6.1–8.1)
Sodium: 142 mmol/L (ref 135–146)

## 2020-04-05 LAB — IRON PROFILE
% SATURATION: 13 % (calc) — ABNORMAL LOW (ref 20–48)
Iron binding capacity: 343 mcg/dL (calc) (ref 250–425)
Iron: 45 ug/dL — ABNORMAL LOW (ref 50–180)

## 2020-04-05 LAB — FERRITIN
Ferritin: 82 ng/mL (ref 38–380)
Ferritin: 82 ng/mL (ref 38–380)

## 2020-04-05 LAB — CBC WITH AUTO DIFFERENTIAL
Basophils %: 1.1 %
Basophils Absolute: 62 cells/uL (ref 0–200)
Eosinophils %: 1.1 %
Eosinophils Absolute: 62 cells/uL (ref 15–500)
Hematocrit: 38.7 % (ref 38.5–50.0)
Hemoglobin: 13.8 g/dL (ref 13.2–17.1)
Lymphocytes %: 30.4 %
Lymphocytes Absolute: 1702 cells/uL (ref 850–3900)
MCH: 40 pg — ABNORMAL HIGH (ref 27.0–33.0)
MCHC: 35.7 g/dL (ref 32.0–36.0)
MCV: 112.2 fL — ABNORMAL HIGH (ref 80.0–100.0)
MPV: 9.6 fL (ref 7.5–12.5)
Monocytes %: 8.9 %
Monocytes Absolute: 498 cells/uL (ref 200–950)
Neutrophils %: 58.5 %
Neutrophils Absolute: 3276 cells/uL (ref 1500–7800)
Platelets: 784 10*3/uL — ABNORMAL HIGH (ref 140–400)
RBC: 3.45 10*6/uL — ABNORMAL LOW (ref 4.20–5.80)
RDW: 11.8 % (ref 11.0–15.0)
WBC: 5.6 10*3/uL (ref 3.8–10.8)

## 2020-04-05 LAB — COMPREHENSIVE METABOLIC PANEL
ALT: 16 U/L (ref 9–46)
AST: 23 U/L (ref 10–35)
Albumin/Globulin Ratio: 1.9 (calc) (ref 1.0–2.5)
Albumin: 4.9 g/dL (ref 3.6–5.1)
Alkaline Phosphatase: 64 U/L (ref 35–144)
BUN: 15 mg/dL (ref 7–25)
CO2: 31 mmol/L (ref 20–32)
Calcium: 10.4 mg/dL — ABNORMAL HIGH (ref 8.6–10.3)
Chloride: 103 mmol/L (ref 98–110)
Creatinine: 1.02 mg/dL (ref 0.70–1.33)
GFR African American: 93 mL/min/{1.73_m2} (ref >=60)
Globulin: 2.6 g/dL (calc) (ref 1.9–3.7)
Glucose: 124 mg/dL — ABNORMAL HIGH (ref 65–99)
Potassium: 5.3 mmol/L (ref 3.5–5.3)
Sodium: 142 mmol/L (ref 135–146)
Total Bilirubin: 1.5 mg/dL — ABNORMAL HIGH (ref 0.2–1.2)
Total Protein: 7.5 g/dL (ref 6.1–8.1)
eGFR NON-AA: 81 mL/min/{1.73_m2} (ref >=60)

## 2020-04-05 LAB — IRON AND TIBC
% Saturation: 13 % — ABNORMAL LOW (ref 20–48)
Iron: 45 ug/dL — ABNORMAL LOW (ref 50–180)
TIBC: 343 ug/dL (ref 250–425)

## 2020-04-11 ENCOUNTER — Telehealth
Admit: 2020-04-11 | Discharge: 2020-04-12 | Payer: PRIVATE HEALTH INSURANCE | Attending: Internal Medicine | Primary: Family Medicine

## 2020-04-11 ENCOUNTER — Telehealth: Attending: Internal Medicine | Primary: Family Medicine

## 2020-04-11 DIAGNOSIS — D75839 Thrombocytosis, unspecified: Secondary | ICD-10-CM

## 2020-04-11 NOTE — Progress Notes (Signed)
James Hurst is a 60 y.o. male, evaluated via audio-only technology on 04/11/2020 for Follow-up  .    Assessment & Plan:   Diagnoses and all orders for this visit:    1. Thrombocytosis  -     CALR MUTATION ANALYSIS; Future  -     BCR-ABL1, PCR, QT; Future  -     MPL MUTATION ANALYSIS; Future    2. Iron deficiency anemia, unspecified iron deficiency anemia type      The complexity of medical decision making for this visit is moderate.    Thrombocytosis   Iron Deficiency  -- The patient has a past medical history significant of Thrombocytosis diagnosed since 2015. He reported he has follow up with Hematologist at [facility] in 2015. He was then transferred to care of Hematologist, Dr. Sabra Heck in [place]. His last seen by Hematologist was in 2019. He denied any bone marrow biopsy performed in the past.    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 He would like to continue Hydroxyurea as previous advised.  -- 12/27/2019: WBC 3.2K/uL, hemoglobin 12.8g/dL with hematocrit 35.5%. Platelet 522. BUN and Creatinine were both normal. TSAT 20%. Ferritin 59.   At last visit he was advised to decrease Hydroxyurea from 15104m PO daily  to 1,0012mPO daily and starting iron pills daily.  -- Today I have reviewed with the patient about recent lab reports.   04/04/2020 H/H 13.8/38.7, WBC 5.6, PLTs 784K.  Iron sat 13%, Ferritin 82.    Plan:  -- He was agreeable to go back to Hydroxyurea 150047maily  -- He was advised to increase iron pills to BID-TID if tolerates  -- He will f/u his GI for screening colonoscopy as directed.  -- Continue lab check CBC with diff, CMP, Iron profile, ferritin, ret count.  Will add CALR/MPL/BCRABL1 to next labs.  -- Follow up in 2 months with repeat labs or sooner if indicated          12  Subjective:   James Hurst a 59 96o. male  who was evaluated via audio-only technology. Today he states he is doing well with no significant issues t report. He states he is currently taking Hydroxyurea 1000m93m daily and tolerating this well. Patient admits  taking his oral iron daily as recommended.   He reported doing well overall. He denies any fevers, chills, recurrent infections, and skin rash. He denies any shortness or breath, chest pains, dizziness, weakness, fatigue, and palpitations. He denies any abdominal pain, nausea, vomiting, diarrhea, and constipation. He denies blood in the urine or stools. He denies dysuria, cough, hoarseness, pale skin, pinpoint red spots on skin, sore throat, leg ulcers, sores, or white spots in mouth, unusual bleeding or bruising. He denies any pain or discomfort. He does not have any concerns or complaints to report at this time.    Prior to Admission medications    Medication Sig Start Date End Date Taking? Authorizing Provider   hydroxyurea (HYDREA) 500 mg capsule TAKE 3 CAPSULES BY MOUTH ONCE DAILY FOR 90 DAYS 02/22/20   Apuli, GladFritzi MandesP   ferrous sulfate 325 mg (65 mg iron) tablet Take 1 Tablet by mouth three (3) times daily. 12/27/19   Otoo, CeciLorna FewDNP   cyclobenzaprine (FLEXERIL) 10 mg tablet TAKE 1 TABLET BY MOUTH EVERY 8 HOURS AS NEEDED FOR MUSCLE SPASM  08/11/19   Provider, Historical   atorvastatin (LIPITOR) 20 mg tablet TAKE 1 TABLET BY MOUTH ONCE DAILY FOR 90 DAYS 08/11/19   Provider, Historical   hydroCHLOROthiazide (HYDRODIURIL) 25 mg tablet TAKE 1 TABLET BY MOUTH ONCE DAILY IN THE MORNING FOR 90 DAYS 08/11/19   Provider, Historical   omeprazole (PRILOSEC) 20 mg capsule TAKE 1 CAPSULE BY MOUTH DAILY 30 MINUTES BEFORE MORNING MEAL 08/11/19   Provider, Historical     There is no problem list on file for this patient.      Review of Systems   Constitutional: Negative for chills, diaphoresis, fever, malaise/fatigue and weight loss.   Respiratory: Negative for cough, hemoptysis, shortness of breath and  wheezing.    Cardiovascular: Negative for chest pain, palpitations and leg swelling.   Gastrointestinal: Negative for abdominal pain, diarrhea, heartburn, nausea and vomiting.   Genitourinary: Negative for dysuria, frequency, hematuria and urgency.   Musculoskeletal: Negative for joint pain and myalgias.   Skin: Negative for itching and rash.   Neurological: Negative for dizziness, seizures, weakness and headaches.   Psychiatric/Behavioral: Negative for depression. The patient does not have insomnia.        Patient-Reported Vitals 01/10/2020   Patient-Reported Weight 187lb         The patient was advised that our priority at this time is to keep the patients safe, and therefore we are reaching out to patients virtually to prevent them coming into the office unless necessarily.  Patient verbalized understanding and was agreeable for virtual visit.    James Hurst, who was evaluated through a patient-initiated, synchronous (real-time) audio only encounter, and/or her healthcare decision maker, is aware that it is a billable service, with coverage as determined by his insurance carrier. He provided verbal consent to proceed: Yes. He has not had a related appointment within my department in the past 7 days or scheduled within the next 24 hours.    I have spent 25 minutes reviewing previous notes, test results and discussing the diagnosis and importance of compliance with the treatment plan as well as documenting on the day of the visit.    James Buffy, MD        CC: Bunnie Domino, NP

## 2020-05-13 ENCOUNTER — Encounter

## 2020-05-13 ENCOUNTER — Inpatient Hospital Stay: Admit: 2020-05-13 | Payer: PRIVATE HEALTH INSURANCE | Primary: Family Medicine

## 2020-05-13 DIAGNOSIS — R079 Chest pain, unspecified: Secondary | ICD-10-CM

## 2020-06-07 ENCOUNTER — Encounter

## 2020-06-07 MED ORDER — HYDROXYUREA 500 MG CAPSULE
500 mg | ORAL_CAPSULE | ORAL | 1 refills | Status: DC
Start: 2020-06-07 — End: 2020-10-15

## 2020-06-17 ENCOUNTER — Ambulatory Visit: Payer: PRIVATE HEALTH INSURANCE | Primary: Family Medicine

## 2020-06-24 ENCOUNTER — Encounter: Attending: Internal Medicine | Primary: Family Medicine

## 2020-07-16 ENCOUNTER — Other Ambulatory Visit: Admit: 2020-07-16 | Discharge: 2020-07-16 | Payer: PRIVATE HEALTH INSURANCE | Primary: Family Medicine

## 2020-07-16 DIAGNOSIS — D75839 Thrombocytosis, unspecified: Secondary | ICD-10-CM

## 2020-07-22 ENCOUNTER — Ambulatory Visit: Payer: PRIVATE HEALTH INSURANCE | Attending: Family | Primary: Family Medicine

## 2020-07-25 LAB — CALR MUTATION ANALYSIS

## 2020-07-25 LAB — CBC WITH AUTOMATED DIFF
ABS. BASOPHILS: 0 10*3/uL (ref 0.0–0.2)
ABS. EOSINOPHILS: 0 10*3/uL (ref 0.0–0.4)
ABS. IMM. GRANS.: 0 10*3/uL (ref 0.0–0.1)
ABS. MONOCYTES: 0.5 10*3/uL (ref 0.1–0.9)
ABS. NEUTROPHILS: 1.4 10*3/uL (ref 1.4–7.0)
Abs Lymphocytes: 1.7 10*3/uL (ref 0.7–3.1)
BASOPHILS: 1 %
EOSINOPHILS: 0 %
HCT: 38.3 % (ref 37.5–51.0)
HGB: 13.9 g/dL (ref 13.0–17.7)
IMMATURE GRANULOCYTES: 0 %
Lymphocytes: 47 %
MCH: 41.2 pg — ABNORMAL HIGH (ref 26.6–33.0)
MCHC: 36.3 g/dL — ABNORMAL HIGH (ref 31.5–35.7)
MCV: 114 fL — ABNORMAL HIGH (ref 79–97)
MONOCYTES: 14 %
NEUTROPHILS: 38 %
PLATELET: 544 10*3/uL — ABNORMAL HIGH (ref 150–450)
RBC: 3.37 x10E6/uL — ABNORMAL LOW (ref 4.14–5.80)
RDW: 16.2 % — ABNORMAL HIGH (ref 11.6–15.4)
WBC: 3.6 10*3/uL (ref 3.4–10.8)

## 2020-07-25 LAB — METABOLIC PANEL, COMPREHENSIVE
A-G Ratio: 2.1 (ref 1.2–2.2)
ALT (SGPT): 16 IU/L (ref 0–44)
AST (SGOT): 19 IU/L (ref 0–40)
Albumin: 4.7 g/dL (ref 3.8–4.9)
Alk. phosphatase: 72 IU/L (ref 44–121)
BUN/Creatinine ratio: 13 (ref 9–20)
BUN: 14 mg/dL (ref 6–24)
Bilirubin, total: 1 mg/dL (ref 0.0–1.2)
CO2: 25 mmol/L (ref 20–29)
Calcium: 9.8 mg/dL (ref 8.7–10.2)
Chloride: 101 mmol/L (ref 96–106)
Creatinine: 1.04 mg/dL (ref 0.76–1.27)
GLOBULIN, TOTAL: 2.2 g/dL (ref 1.5–4.5)
Glucose: 95 mg/dL (ref 65–99)
Potassium: 4.7 mmol/L (ref 3.5–5.2)
Protein, total: 6.9 g/dL (ref 6.0–8.5)
Sodium: 141 mmol/L (ref 134–144)
eGFR: 83 mL/min/{1.73_m2} (ref >59)

## 2020-07-25 LAB — BCR-ABL1, PCR, QT
INTERPRETATION: 480488: NEGATIVE
Interpretation:: NEGATIVE

## 2020-07-25 LAB — IRON PROFILE
Iron % saturation: 24 % (ref 15–55)
Iron: 75 ug/dL (ref 38–169)
TIBC: 319 ug/dL (ref 250–450)
UIBC: 244 ug/dL (ref 111–343)

## 2020-07-25 LAB — MPL MUTATION ANALYSIS

## 2020-07-25 LAB — RETICULOCYTE COUNT
Retic Ct Pct: 1.9 % (ref 0.6–2.6)
Reticulocyte count: 1.9 % (ref 0.6–2.6)

## 2020-07-25 LAB — FERRITIN
Ferritin: 158 ng/mL (ref 30–400)
Ferritin: 158 ng/mL (ref 30–400)

## 2020-07-25 LAB — CBC WITH AUTO DIFFERENTIAL
Basophils %: 1 %
Basophils Absolute: 0 10*3/uL (ref 0.0–0.2)
Eosinophils %: 0 %
Eosinophils Absolute: 0 10*3/uL (ref 0.0–0.4)
Granulocyte Absolute Count: 0 10*3/uL (ref 0.0–0.1)
Hematocrit: 38.3 % (ref 37.5–51.0)
Hemoglobin: 13.9 g/dL (ref 13.0–17.7)
Immature Granulocytes %: 0 %
Lymphocytes %: 47 %
Lymphocytes Absolute: 1.7 10*3/uL (ref 0.7–3.1)
MCH: 41.2 pg — ABNORMAL HIGH (ref 26.6–33.0)
MCHC: 36.3 g/dL — ABNORMAL HIGH (ref 31.5–35.7)
MCV: 114 fL — ABNORMAL HIGH (ref 79–97)
Monocytes %: 14 %
Monocytes Absolute: 0.5 10*3/uL (ref 0.1–0.9)
Neutrophils %: 38 %
Neutrophils Absolute: 1.4 10*3/uL (ref 1.4–7.0)
Platelets: 544 10*3/uL — ABNORMAL HIGH (ref 150–450)
RBC: 3.37 x10E6/uL — ABNORMAL LOW (ref 4.14–5.80)
RDW: 16.2 % — ABNORMAL HIGH (ref 11.6–15.4)
WBC: 3.6 10*3/uL (ref 3.4–10.8)

## 2020-07-25 LAB — COMPREHENSIVE METABOLIC PANEL
ALT: 16 IU/L (ref 0–44)
AST: 19 IU/L (ref 0–40)
Albumin/Globulin Ratio: 2.1 NA (ref 1.2–2.2)
Albumin: 4.7 g/dL (ref 3.8–4.9)
Alkaline Phosphatase: 72 IU/L (ref 44–121)
BUN/Creatinine Ratio: 13 NA (ref 9–20)
BUN: 14 mg/dL (ref 6–24)
CO2: 25 mmol/L (ref 20–29)
Calcium: 9.8 mg/dL (ref 8.7–10.2)
Chloride: 101 mmol/L (ref 96–106)
Creatinine: 1.04 mg/dL (ref 0.76–1.27)
Est, Glom Filt Rate: 83 mL/min/{1.73_m2} (ref >59)
Globulin, Total: 2.2 g/dL (ref 1.5–4.5)
Glucose: 95 mg/dL (ref 65–99)
Potassium: 4.7 mmol/L (ref 3.5–5.2)
Sodium: 141 mmol/L (ref 134–144)
Total Bilirubin: 1 mg/dL (ref 0.0–1.2)
Total Protein: 6.9 g/dL (ref 6.0–8.5)

## 2020-07-25 LAB — IRON AND TIBC
Iron % Saturation: 24 % (ref 15–55)
Iron: 75 ug/dL (ref 38–169)
TIBC: 319 ug/dL (ref 250–450)
UIBC: 244 ug/dL (ref 111–343)

## 2020-07-31 ENCOUNTER — Ambulatory Visit
Admit: 2020-07-31 | Discharge: 2020-07-31 | Payer: PRIVATE HEALTH INSURANCE | Attending: Internal Medicine | Primary: Family Medicine

## 2020-07-31 ENCOUNTER — Ambulatory Visit: Attending: Internal Medicine | Primary: Family Medicine

## 2020-07-31 DIAGNOSIS — D75839 Thrombocytosis, unspecified: Secondary | ICD-10-CM

## 2020-07-31 MED ORDER — FERROUS SULFATE 325 MG (65 MG ELEMENTAL IRON) TAB
325 mg (65 mg iron) | ORAL_TABLET | Freq: Every day | ORAL | 5 refills | Status: AC
Start: 2020-07-31 — End: ?

## 2020-07-31 NOTE — Progress Notes (Signed)
Hematology/Oncology Note      Date: 07/31/2020    Name: James Hurst  DOB: [birth year]        Mickle Plumb, MD         Subjective:     Chief complaint: Thrombocytosis    History of Present Illness:   James Hurst is a most pleasant 60 y.o. year old male who was seen for consultation of Thrombocytosis.   The patient has a past medical history significant of Thrombocytosis diagnosed since 2015. He reported he has follow up with Hematologist at [facility] in 2015. He was then transferred to care to Hematologist, Dr. Sabra Heck in [place]. His last seen by Hematologist was in 2019.  He denied any bone marrow biopsy performed.   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 Presently he is taking Hydroxyurea 1500 mg daily for the past 4 months. He reported doing well with Hydroxyurea without any tolerating issues.   The patient reported doing well overall.  He otherwise has no other complaints. Denied fever, chills, night sweat, unintentional weight loss, skin lumps or bumps, acute bleeding or bruising issues. Denied headache, acute vision change, dizziness, chest pain, worsen shortness of breath, palpitation, productive cough, nausea, vomiting, abdominal pain, altered bowel habits, dysuria, worsen bone pain or back pain, new focal numbness or weakness.           Past Medical History, Family History, and Social History:    Past Medical History:   Diagnosis Date   ??? Hypertension      History reviewed. No pertinent surgical history.  Social History     Socioeconomic History   ??? Marital status: SINGLE     Spouse name: Not on file   ??? Number of children: Not on file   ??? Years of education: Not on file   ??? Highest education level: Not on file   Occupational History   ??? Not on file   Tobacco Use   ??? Smoking status: Never Smoker   ??? Smokeless tobacco: Never Used   Vaping Use   ??? Vaping Use: Never used   Substance and Sexual Activity   ??? Alcohol use: Not Currently   ??? Drug use: Never    ??? Sexual activity: Not on file   Other Topics Concern   ??? Not on file   Social History Narrative   ??? Not on file     Social Determinants of Health     Financial Resource Strain:    ??? Difficulty of Paying Living Expenses: Not on file   Food Insecurity:    ??? Worried About Running Out of Food in the Last Year: Not on file   ??? Ran Out of Food in the Last Year: Not on file   Transportation Needs:    ??? Lack of Transportation (Medical): Not on file   ??? Lack of Transportation (Non-Medical): Not on file   Physical Activity:    ??? Days of Exercise per Week: Not on file   ??? Minutes of Exercise per Session: Not on file   Stress:    ??? Feeling of Stress : Not on file   Social Connections:    ??? Frequency of Communication with Friends and Family: Not on file   ??? Frequency of Social Gatherings with Friends and Family: Not on file   ??? Attends Religious Services: Not on file   ??? Active Member of Clubs or Organizations: Not on file   ??? Attends  Archivist Meetings: Not on file   ??? Marital Status: Not on file   Intimate Partner Violence:    ??? Fear of Current or Ex-Partner: Not on file   ??? Emotionally Abused: Not on file   ??? Physically Abused: Not on file   ??? Sexually Abused: Not on file   Housing Stability:    ??? Unable to Pay for Housing in the Last Year: Not on file   ??? Number of Places Lived in the Last Year: Not on file   ??? Unstable Housing in the Last Year: Not on file     History reviewed. No pertinent family history.  Current Outpatient Medications   Medication Sig Dispense Refill   ??? ferrous sulfate 325 mg (65 mg iron) tablet Take 1 Tablet by mouth daily. 90 Tablet 5   ??? hydroxyurea (HYDREA) 500 mg capsule TAKE 3 CAPSULES BY MOUTH ONCE DAILY FOR 90 DAYS 180 Capsule 1   ??? aspirin 81 mg chewable tablet CHEW AND SWALLOW 1 TABLET BY MOUTH ONCE DAILY FOR 90 DAYS     ??? atorvastatin (LIPITOR) 20 mg tablet TAKE 1 TABLET BY MOUTH ONCE DAILY FOR 90 DAYS     ??? hydroCHLOROthiazide (HYDRODIURIL) 25 mg tablet TAKE 1 TABLET BY MOUTH  ONCE DAILY IN THE MORNING FOR 90 DAYS     ??? omeprazole (PRILOSEC) 20 mg capsule TAKE 1 CAPSULE BY MOUTH DAILY 30 MINUTES BEFORE MORNING MEAL (Patient not taking: Reported on 07/31/2020)         Review of Systems   Constitutional: Negative for chills, diaphoresis, fever, malaise/fatigue and weight loss.   Respiratory: Negative for cough, hemoptysis, shortness of breath and wheezing.    Cardiovascular: Negative for chest pain, palpitations and leg swelling.   Gastrointestinal: Negative for abdominal pain, diarrhea, heartburn, nausea and vomiting.   Genitourinary: Negative for dysuria, frequency, hematuria and urgency.   Musculoskeletal: Negative for joint pain and myalgias.   Skin: Negative for itching and rash.   Neurological: Negative for dizziness, seizures, weakness and headaches.   Psychiatric/Behavioral: Negative for depression. The patient does not have insomnia.             Objective:     Visit Vitals  BP 133/84   Pulse 75   Resp 16   Ht 5' 10.5" (1.791 m)   Wt 79.8 kg (176 lb)   SpO2 98%   BMI 24.90 kg/m??       ECOG Performance Status (grade): 0  0 - able to carry on all pre-disease activity w/out restriction  1 - restricted but able to carry out light work  2 - ambulatory and can self- care but unable to carry out work  3 - bed or chair >50% of waking hours  4 - completely disable, total care, confined to bed or chair    Physical Exam  Constitutional:       General: He is not in acute distress.  HENT:      Head: Normocephalic and atraumatic.   Eyes:      Pupils: Pupils are equal, round, and reactive to light.   Cardiovascular:      Pulses: Normal pulses.      Heart sounds: Normal heart sounds. No murmur heard.      Pulmonary:      Effort: Pulmonary effort is normal. No respiratory distress.      Breath sounds: Normal breath sounds.   Abdominal:      General: Bowel sounds are normal. There  is no distension.      Palpations: Abdomen is soft. There is no mass.      Tenderness: There is no abdominal tenderness.  There is no guarding.   Musculoskeletal:         General: No swelling or tenderness.      Cervical back: Neck supple. No rigidity.   Lymphadenopathy:      Cervical: No cervical adenopathy.   Skin:     General: Skin is warm.      Findings: No rash.   Neurological:      Mental Status: He is alert and oriented to person, place, and time. Mental status is at baseline.      Cranial Nerves: No cranial nerve deficit.   Psychiatric:         Mood and Affect: Mood normal.          Diagnostics:      No results found for this or any previous visit (from the past 96 hour(s)).    Imaging:  No results found for this or any previous visit.      Results for orders placed during the hospital encounter of 05/13/20    XR CHEST PA LAT    Narrative  EXAMINATION: Chest 2 views    INDICATION: Chest pain    COMPARISON: None    FINDINGS: Frontal and lateral views of the chest obtained. Mediastinal  silhouette and pulmonary vasculature unremarkable. No consolidation. No evidence  of pneumothorax. No acute osseous findings.    Impression  No acute findings.      No results found for this or any previous visit.        Pathology          Assessment:                                        1. Thrombocytosis    2. Iron deficiency anemia, unspecified iron deficiency anemia type        Plan:                                        Thrombocytosis   Iron Deficiency  -- The patient has a past medical history significant of Thrombocytosis diagnosed since 2015. He reported he has follow up with Hematologist at [facility] in 2015. He was then transferred to care of Hematologist, Dr. Sabra Heck in [place]. His last seen by Hematologist was in 2019. He denied any bone marrow biopsy performed in the past.    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 He would like to continue Hydroxyurea as  previous advised.  -- 12/27/2019: WBC 3.2K/uL, hemoglobin 12.8g/dL with hematocrit 35.5%. Platelet 522. BUN and Creatinine were both normal. TSAT 20%. Ferritin 59.   -- 04/04/2020 H/H 13.8/38.7, WBC 5.6, PLTs 784K.  Iron sat 13%, Ferritin 82.  -- He has been on HA 1513m daily without any issues.   -- Today I have reviewed with the patient about recent lab reports.   07/16/2020 H/H 13.9/38, WBC 3.6, PLTs 544K  Negative BCR/ABL1 and MPL. CALR MUTATION    Plan:  -- We discussed about BMBx. 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 He was reluctant. He will inform his  decision next visit.  -- He will continue Hydroxyurea 1526m daily  -- He was advised to increase iron pills to BID-TID if tolerates  -- He will f/u his GI for screening colonoscopy as directed.  -- Continue lab check CBC with diff, CMP, Iron profile, ferritin, ret count.  -- Follow up in 2 months with repeat labs or sooner if indicated        Orders Placed This Encounter   ??? ferrous sulfate 325 mg (65 mg iron) tablet     Sig: Take 1 Tablet by mouth daily.     Dispense:  90 Tablet     Refill:  5           James Hurst a reminder for a "due or due soon" health maintenance. I have asked that he contact his primary care provider for follow-up on this health maintenance.   All of patient's questions answered to their apparent satisfaction. They verbally show understanding and agreement with aforementioned plan.       TFlossie Buffy MD  07/31/2020          Parts of this document has been produced using Dragon dictation system.  Unrecognized errors in transcription may be present. Please Antonia Jicha not hesitate to reach out for any questions or clarifications.      CC: AMickle Plumb MD

## 2020-09-26 ENCOUNTER — Encounter: Primary: Family Medicine

## 2020-09-27 ENCOUNTER — Other Ambulatory Visit: Admit: 2020-09-27 | Discharge: 2020-09-27 | Payer: PRIVATE HEALTH INSURANCE | Primary: Family Medicine

## 2020-09-27 DIAGNOSIS — D75839 Thrombocytosis, unspecified: Secondary | ICD-10-CM

## 2020-09-30 LAB — METABOLIC PANEL, COMPREHENSIVE
A-G Ratio: 2.3 — ABNORMAL HIGH (ref 1.2–2.2)
ALT (SGPT): 16 IU/L (ref 0–44)
AST (SGOT): 19 IU/L (ref 0–40)
Albumin: 4.8 g/dL (ref 3.8–4.9)
Alk. phosphatase: 80 IU/L (ref 44–121)
BUN/Creatinine ratio: 15 (ref 9–20)
BUN: 15 mg/dL (ref 6–24)
Bilirubin, total: 1.2 mg/dL (ref 0.0–1.2)
CO2: 26 mmol/L (ref 20–29)
Calcium: 9.5 mg/dL (ref 8.7–10.2)
Chloride: 102 mmol/L (ref 96–106)
Creatinine: 0.97 mg/dL (ref 0.76–1.27)
GLOBULIN, TOTAL: 2.1 g/dL (ref 1.5–4.5)
Glucose: 109 mg/dL — ABNORMAL HIGH (ref 65–99)
Potassium: 4 mmol/L (ref 3.5–5.2)
Protein, total: 6.9 g/dL (ref 6.0–8.5)
Sodium: 142 mmol/L (ref 134–144)
eGFR: 90 mL/min/{1.73_m2} (ref >59)

## 2020-09-30 LAB — CBC WITH AUTOMATED DIFF
ABS. BASOPHILS: 0 10*3/uL (ref 0.0–0.2)
ABS. EOSINOPHILS: 0 10*3/uL (ref 0.0–0.4)
ABS. IMM. GRANS.: 0 10*3/uL (ref 0.0–0.1)
ABS. MONOCYTES: 0.4 10*3/uL (ref 0.1–0.9)
ABS. NEUTROPHILS: 1.6 10*3/uL (ref 1.4–7.0)
Abs Lymphocytes: 1.7 10*3/uL (ref 0.7–3.1)
BASOPHILS: 1 %
EOSINOPHILS: 1 %
HCT: 38.6 % (ref 37.5–51.0)
HGB: 13.5 g/dL (ref 13.0–17.7)
IMMATURE GRANULOCYTES: 1 %
Lymphocytes: 43 %
MCH: 41 pg — ABNORMAL HIGH (ref 26.6–33.0)
MCHC: 35 g/dL (ref 31.5–35.7)
MCV: 117 fL — ABNORMAL HIGH (ref 79–97)
MONOCYTES: 12 %
NEUTROPHILS: 42 %
PLATELET: 293 10*3/uL (ref 150–450)
RBC: 3.29 x10E6/uL — ABNORMAL LOW (ref 4.14–5.80)
RDW: 13.8 % (ref 11.6–15.4)
WBC: 3.8 10*3/uL (ref 3.4–10.8)

## 2020-09-30 LAB — IRON PROFILE
Iron % saturation: 16 % (ref 15–55)
Iron: 47 ug/dL (ref 38–169)
TIBC: 288 ug/dL (ref 250–450)
UIBC: 241 ug/dL (ref 111–343)

## 2020-09-30 LAB — FERRITIN
Ferritin: 203 ng/mL (ref 30–400)
Ferritin: 203 ng/mL (ref 30–400)

## 2020-09-30 LAB — CBC WITH AUTO DIFFERENTIAL
Basophils %: 1 %
Basophils Absolute: 0 10*3/uL (ref 0.0–0.2)
Eosinophils %: 1 %
Eosinophils Absolute: 0 10*3/uL (ref 0.0–0.4)
Granulocyte Absolute Count: 0 10*3/uL (ref 0.0–0.1)
Hematocrit: 38.6 % (ref 37.5–51.0)
Hemoglobin: 13.5 g/dL (ref 13.0–17.7)
Immature Granulocytes %: 1 %
Lymphocytes %: 43 %
Lymphocytes Absolute: 1.7 10*3/uL (ref 0.7–3.1)
MCH: 41 pg — ABNORMAL HIGH (ref 26.6–33.0)
MCHC: 35 g/dL (ref 31.5–35.7)
MCV: 117 fL — ABNORMAL HIGH (ref 79–97)
Monocytes %: 12 %
Monocytes Absolute: 0.4 10*3/uL (ref 0.1–0.9)
Neutrophils %: 42 %
Neutrophils Absolute: 1.6 10*3/uL (ref 1.4–7.0)
Platelets: 293 10*3/uL (ref 150–450)
RBC: 3.29 x10E6/uL — ABNORMAL LOW (ref 4.14–5.80)
RDW: 13.8 % (ref 11.6–15.4)
WBC: 3.8 10*3/uL (ref 3.4–10.8)

## 2020-09-30 LAB — IRON AND TIBC
Iron % Saturation: 16 % (ref 15–55)
Iron: 47 ug/dL (ref 38–169)
TIBC: 288 ug/dL (ref 250–450)
UIBC: 241 ug/dL (ref 111–343)

## 2020-09-30 LAB — COMPREHENSIVE METABOLIC PANEL
ALT: 16 IU/L (ref 0–44)
AST: 19 IU/L (ref 0–40)
Albumin/Globulin Ratio: 2.3 NA — ABNORMAL HIGH (ref 1.2–2.2)
Albumin: 4.8 g/dL (ref 3.8–4.9)
Alkaline Phosphatase: 80 IU/L (ref 44–121)
BUN/Creatinine Ratio: 15 NA (ref 9–20)
BUN: 15 mg/dL (ref 6–24)
CO2: 26 mmol/L (ref 20–29)
Calcium: 9.5 mg/dL (ref 8.7–10.2)
Chloride: 102 mmol/L (ref 96–106)
Creatinine: 0.97 mg/dL (ref 0.76–1.27)
Est, Glom Filt Rate: 90 mL/min/{1.73_m2} (ref >59)
Globulin, Total: 2.1 g/dL (ref 1.5–4.5)
Glucose: 109 mg/dL — ABNORMAL HIGH (ref 65–99)
Potassium: 4 mmol/L (ref 3.5–5.2)
Sodium: 142 mmol/L (ref 134–144)
Total Bilirubin: 1.2 mg/dL (ref 0.0–1.2)
Total Protein: 6.9 g/dL (ref 6.0–8.5)

## 2020-10-03 ENCOUNTER — Ambulatory Visit: Payer: PRIVATE HEALTH INSURANCE | Attending: Internal Medicine | Primary: Family Medicine

## 2020-10-15 ENCOUNTER — Encounter

## 2020-10-15 MED ORDER — HYDROXYUREA 500 MG CAPSULE
500 mg | ORAL_CAPSULE | ORAL | 1 refills | Status: AC
Start: 2020-10-15 — End: ?

## 2020-10-15 NOTE — Telephone Encounter (Signed)
Patient reports he is out of refills and out of medication

## 2020-10-18 ENCOUNTER — Encounter: Payer: PRIVATE HEALTH INSURANCE | Attending: Internal Medicine | Primary: Family Medicine

## 2020-11-05 ENCOUNTER — Ambulatory Visit
Admit: 2020-11-05 | Discharge: 2020-11-08 | Payer: PRIVATE HEALTH INSURANCE | Attending: Internal Medicine | Primary: Family Medicine

## 2020-11-05 ENCOUNTER — Inpatient Hospital Stay: Admit: 2020-11-05 | Payer: PRIVATE HEALTH INSURANCE | Primary: Family Medicine

## 2020-11-05 ENCOUNTER — Ambulatory Visit: Attending: Internal Medicine | Primary: Family Medicine

## 2020-11-05 DIAGNOSIS — D75839 Thrombocytosis, unspecified: Secondary | ICD-10-CM

## 2020-11-05 LAB — CBC WITH 3 PART DIFF
ABS. LYMPHOCYTES: 2 10*3/uL (ref 1.1–5.9)
ABS. MIXED CELLS: 0.7 10*3/uL (ref 0.0–2.3)
ABS. NEUTROPHILS: 2.4 10*3/uL (ref 1.8–9.5)
ABSOLUTE MIXED CELLS: 0.7 10*3/uL (ref 0.0–2.3)
HCT: 41.4 % (ref 36–48)
HGB: 14.4 g/dL (ref 12.0–16)
Hematocrit: 41.4 % (ref 36–48)
Hemoglobin: 14.4 g/dL (ref 12.0–16)
LYMPHOCYTES: 40 % (ref 14–44)
Lymphocytes %: 40 % (ref 14–44)
Lymphocytes Absolute: 2 10*3/uL (ref 1.1–5.9)
MCH: 40.8 PG — ABNORMAL HIGH (ref 25.0–35.0)
MCH: 40.8 PG — ABNORMAL HIGH (ref 25.0–35.0)
MCHC: 34.8 g/dL (ref 31–37)
MCHC: 34.8 g/dL (ref 31–37)
MCV: 117.3 FL — ABNORMAL HIGH (ref 78–102)
MCV: 117.3 FL — ABNORMAL HIGH (ref 78–102)
Mixed Cells: 14 % (ref 0.1–17)
Mixed cells: 14 % (ref 0.1–17)
NEUTROPHILS: 46 % (ref 40–70)
Neutrophils %: 46 % (ref 40–70)
Neutrophils Absolute: 2.4 10*3/uL (ref 1.8–9.5)
PLATELET: 573 10*3/uL — ABNORMAL HIGH (ref 140–440)
Platelets: 573 10*3/uL — ABNORMAL HIGH (ref 140–440)
RBC: 3.53 M/uL — ABNORMAL LOW (ref 4.10–5.10)
RBC: 3.53 M/uL — ABNORMAL LOW (ref 4.10–5.10)
RDW: 14.9 % — ABNORMAL HIGH (ref 11.5–14.5)
RDW: 14.9 % — ABNORMAL HIGH (ref 11.5–14.5)
WBC: 5.1 10*3/uL (ref 4.5–13.0)
WBC: 5.1 10*3/uL (ref 4.5–13.0)

## 2020-11-05 NOTE — Progress Notes (Signed)
Progress  Notes by Flossie Buffy, MD at 11/05/20 0945                Author: Flossie Buffy, MD  Service: --  Author Type: Physician       Filed: 11/08/20 2105  Encounter Date: 11/05/2020  Status: Signed          Editor: Flossie Buffy, MD (Physician)               Hematology/Oncology Note         Date: 11/05/2020      Name: James Hurst   DOB: Oct 03, 1960            James Plumb, MD               Subjective:        Chief complaint: Thrombocytosis      History of Present Illness:    James Hurst is a   60 y.o. year old male who was seen for consultation of Thrombocytosis.    The patient has a past medical history significant of Thrombocytosis diagnosed since 2015. He reported he has follow up with Hematologist at [facility] in 2015. He was then transferred to care to Hematologist, Dr. Sabra Heck in [place]. His last seen  by Hematologist there was in 2019.   He denied any bone marrow biopsy performed.    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 Presently he is taking Hydroxyurea 1500 mg daily for the past 4 months. He reported doing well with Hydroxyurea without any tolerating issues.    The patient reported doing well overall since last visit.  He otherwise has no other complaints. Denied fever, chills, night sweat, unintentional weight loss, skin lumps or bumps, acute bleeding or bruising issues. Denied headache, acute vision change,  dizziness, chest pain, worsen shortness of breath, palpitation, productive cough, nausea, vomiting, abdominal pain, altered bowel habits, dysuria, worsen bone pain or back pain, new focal numbness or weakness.                Past Medical History, Family History, and Social History:        Past Medical History:        Diagnosis  Date         ?  Hypertension          History reviewed. No pertinent surgical history.     Social History          Socioeconomic History         ?  Marital status:  SINGLE              Spouse name:  Not on file          ?  Number of children:  Not on file     ?  Years of education:  Not on file     ?  Highest education level:  Not on file       Occupational History        ?  Not on file       Tobacco Use         ?  Smoking status:  Never     ?  Smokeless tobacco:  Never       Vaping Use         ?  Vaping Use:  Never used       Substance and Sexual Activity         ?  Alcohol use:  Not Currently     ?  Drug use:  Never     ?  Sexual activity:  Not on file        Other Topics  Concern        ?  Not on file       Social History Narrative        ?  Not on file          Social Determinants of Health          Financial Resource Strain: Not on file     Food Insecurity: Not on file     Transportation Needs: Not on file     Physical Activity: Not on file     Stress: Not on file     Social Connections: Not on file     Intimate Partner Violence: Not on file       Housing Stability: Not on file        History reviewed. No pertinent family history.     Current Outpatient Medications          Medication  Sig  Dispense  Refill           ?  hydroxyurea (HYDREA) 500 mg capsule  TAKE 3 CAPSULES BY MOUTH ONCE DAILY FOR 90 DAYS  180 Capsule  1     ?  ferrous sulfate 325 mg (65 mg iron) tablet  Take 1 Tablet by mouth daily.  90 Tablet  5     ?  aspirin 81 mg chewable tablet  CHEW AND SWALLOW 1 TABLET BY MOUTH ONCE DAILY FOR 90 DAYS         ?  atorvastatin (LIPITOR) 20 mg tablet  TAKE 1 TABLET BY MOUTH ONCE DAILY FOR 90 DAYS         ?  hydroCHLOROthiazide (HYDRODIURIL) 25 mg tablet  TAKE 1 TABLET BY MOUTH ONCE DAILY IN THE MORNING FOR 90 DAYS               ?  omeprazole (PRILOSEC) 20 mg capsule  TAKE 1 CAPSULE BY MOUTH DAILY 30 MINUTES BEFORE MORNING MEAL (Patient not taking: Reported on 07/31/2020)               Review of Systems    Constitutional:  Negative for chills, diaphoresis, fever, malaise/fatigue and weight loss.    Respiratory:  Negative for cough, hemoptysis, shortness of breath and wheezing.     Cardiovascular:  Negative for chest pain,  palpitations and leg swelling.    Gastrointestinal:  Negative for abdominal pain, diarrhea, heartburn, nausea and vomiting.    Genitourinary:  Negative for dysuria, frequency, hematuria and urgency.    Musculoskeletal:  Negative for joint pain and myalgias.    Skin:  Negative for itching and rash.    Neurological:  Negative for dizziness, seizures, weakness and headaches.    Psychiatric/Behavioral:  Negative for depression. The patient does not have insomnia.                 Objective:        Visit Vitals      BP  (!) 139/90     Pulse  74     Resp  16     Ht  5' 10"  (1.778 m)     Wt  84.6 kg (186 lb 6.4 oz)     SpO2  97%        BMI  26.75 kg/m??  ECOG Performance Status (grade): 0   0 - able to carry on all pre-disease activity w/out restriction   1 - restricted but able to carry out light work   2 - ambulatory and can self- care but unable to carry out work   3 - bed or chair >50% of waking hours   4 - completely disable, total care, confined to bed or chair         Physical Exam   Constitutional :        General: He is not in acute distress.   HENT:       Head: Normocephalic and atraumatic.    Eyes:       Pupils: Pupils are equal, round, and reactive to light.     Cardiovascular:       Pulses: Normal pulses.       Heart sounds: Normal heart sounds. No murmur heard.   Pulmonary:       Effort: Pulmonary effort is normal. No respiratory distress.       Breath sounds: Normal breath sounds.     Abdominal:       General: Bowel sounds are normal. There is no distension.       Palpations: Abdomen is soft. There is no mass.       Tenderness: no abdominal tenderness There is no guarding.     Musculoskeletal:          General: No swelling or tenderness.       Cervical back: Neck supple. No rigidity.     Lymphadenopathy:       Cervical: No cervical adenopathy.    Skin:      General: Skin is warm.       Findings: No rash.    Neurological:       Mental Status: He is alert and oriented to person, place, and time. Mental  status is at baseline.       Cranial Nerves: No cranial nerve deficit.    Psychiatric:          Mood and Affect: Mood normal.           Diagnostics:             No results found for this or any previous visit (from the past 96 hour(s)).      Imaging:   No results found for this or any previous visit.         Results for orders placed during the hospital encounter of 05/13/20      XR CHEST PA LAT      Narrative   EXAMINATION: Chest 2 views      INDICATION: Chest pain      COMPARISON: None      FINDINGS: Frontal and lateral views of the chest obtained. Mediastinal   silhouette and pulmonary vasculature unremarkable. No consolidation. No evidence   of pneumothorax. No acute osseous findings.      Impression   No acute findings.         No results found for this or any previous visit.            Pathology                Assessment:  1.  Thrombocytosis       2.  Iron deficiency anemia, unspecified iron deficiency anemia type                   Plan:                                               Thrombocytosis    Iron Deficiency   -- The patient has a past medical history significant of Thrombocytosis diagnosed since 2015. He reported he has follow up with Hematologist at [facility] in 2015. He was then transferred to care  of Hematologist, Dr. Sabra Heck in [place]. His last seen by Hematologist was in 2019. He denied any bone marrow biopsy performed in the past.     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 He would like to continue Hydroxyurea as previous advised.   -- 12/27/2019: WBC 3.2K/uL, hemoglobin 12.8g/dL with hematocrit 35.5%. Platelet 522. BUN and Creatinine were both normal. TSAT 20%. Ferritin 59.    -- 04/04/2020 H/H 13.8/38.7, WBC 5.6, PLTs 784K.   Iron sat 13%, Ferritin 82.   -- He has been on  HA 1532m daily without any issues.    -- 07/16/2020 H/H 13.9/38, WBC 3.6, PLTs 544K   Negative BCR/ABL1 and MPL. CALR MUTATION.    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 He declined bone marrow biopsy.   He would like to remain on Hydroxyurea.    -- Today I have reviewed with the patient about recent lab reports.    09/27/2020 CBC reported hemoglobin stable 13.5, hematocrit 38.6%, WBC 3.8, ANC 1.6, platelet 293,000.   Iron saturation for 16%, TIBC 288, ferritin 203.         Plan:   -- He will continue Hydroxyurea 15080mdaily.    -- Will fu CBC today   -- He states he will relocate to [place] soon. Advised the patient to establish an early care with local Oncologist for continuing management.    -- He will continue iron pills daily if tolerates   -- He will f/u his GI for screening colonoscopy as directed.   -- Follow up with our clinic as needed.            Orders Placed This Encounter      ?  COMPLETE CBC & AUTO DIFF WBC      ?  InHouse CBC ([facility])          Standing Status:    Future          Standing Expiration Date:    11/12/2020                        Mr. SlKrempaskyas a reminder for a "due or due soon" health maintenance. I have asked that he contact his primary care provider for follow-up on this health maintenance.    All of patient's questions answered to their apparent satisfaction. They verbally show understanding and agreement with aforementioned plan.          TiFlossie BuffyMD   11/05/2020               Parts of this document has been produced  using Dragon dictation system.  Unrecognized errors in transcription may be present. Please Talayah Picardi not hesitate to reach out for any questions or clarifications.         CC: James Plumb, MD

## 2020-11-06 ENCOUNTER — Ambulatory Visit: Payer: PRIVATE HEALTH INSURANCE | Attending: Internal Medicine | Primary: Family Medicine

## 2021-09-30 ENCOUNTER — Ambulatory Visit: Admit: 2021-09-30 | Discharge: 2021-09-30 | Attending: Physician Assistant | Primary: Family Medicine

## 2021-09-30 ENCOUNTER — Encounter

## 2021-09-30 DIAGNOSIS — D75839 Thrombocytosis, unspecified: Secondary | ICD-10-CM

## 2021-09-30 LAB — COMPREHENSIVE METABOLIC PANEL
ALT: 31 U/L (ref 12–65)
AST: 30 U/L (ref 15–37)
Albumin/Globulin Ratio: 1.3 (ref 0.4–1.6)
Albumin: 3.5 g/dL (ref 3.2–4.6)
Alk Phosphatase: 59 U/L (ref 50–136)
Anion Gap: 3 mmol/L (ref 2–11)
BUN: 16 MG/DL (ref 8–23)
CO2: 30 mmol/L (ref 21–32)
Calcium: 9.1 MG/DL (ref 8.3–10.4)
Chloride: 111 mmol/L — ABNORMAL HIGH (ref 101–110)
Creatinine: 1.3 MG/DL (ref 0.8–1.5)
Est, Glom Filt Rate: >60 mL/min/{1.73_m2} (ref >60)
Globulin: 2.7 g/dL — ABNORMAL LOW (ref 2.8–4.5)
Glucose: 130 mg/dL — ABNORMAL HIGH (ref 65–100)
Potassium: 4.3 mmol/L (ref 3.5–5.1)
Sodium: 144 mmol/L — ABNORMAL HIGH (ref 133–143)
Total Bilirubin: 0.8 MG/DL (ref 0.2–1.1)
Total Protein: 6.2 g/dL — ABNORMAL LOW (ref 6.3–8.2)

## 2021-09-30 LAB — LIPID PANEL
Chol/HDL Ratio: 2.6
Cholesterol, Total: 127 MG/DL (ref <200)
HDL: 48 MG/DL (ref 40–60)
LDL Calculated: 60.8 MG/DL (ref <100)
Triglycerides: 91 MG/DL (ref 35–150)
VLDL Cholesterol Calculated: 18.2 MG/DL (ref 6.0–23.0)

## 2021-09-30 LAB — FERRITIN: Ferritin: 67 NG/ML (ref 8–388)

## 2021-09-30 LAB — IRON: Iron: 52 ug/dL (ref 35–150)

## 2021-09-30 LAB — TIBC: TIBC: 296 ug/dL (ref 250–450)

## 2021-09-30 MED ORDER — HYDROXYUREA 500 MG PO CAPS
500 MG | ORAL_CAPSULE | ORAL | 0 refills | Status: AC
Start: 2021-09-30 — End: ?

## 2021-09-30 MED ORDER — HYDROCHLOROTHIAZIDE 25 MG PO TABS
25 MG | ORAL_TABLET | ORAL | 0 refills | Status: AC
Start: 2021-09-30 — End: 2022-02-03

## 2021-09-30 MED ORDER — OMEPRAZOLE 40 MG PO CPDR
40 MG | ORAL_CAPSULE | Freq: Every day | ORAL | 0 refills | Status: AC
Start: 2021-09-30 — End: 2022-02-03

## 2021-09-30 MED ORDER — ATORVASTATIN CALCIUM 20 MG PO TABS
20 MG | ORAL_TABLET | Freq: Every day | ORAL | 0 refills | Status: DC
Start: 2021-09-30 — End: 2022-02-03

## 2021-09-30 NOTE — Progress Notes (Incomplete)
James Hurst (DOB:  1960/09/22) is a 61 y.o. male here for evaluation of the following chief complaint(s):  New Patient (Patient presents for a new patient appointment and to establish care to manage his medication, would like to check some bloodwork) and Medication Refill (Pt needs refills on all his medications)         ASSESSMENT/PLAN:  1. Thrombocytosis  -     CBC with Auto Differential; Future  -     [facility]  -     hydroxyurea (HYDREA) 500 MG chemo capsule; TAKE 3 CAPSULES BY MOUTH ONCE DAILY FOR 90 DAYS, Disp-270 capsule, R-0Normal  2. Essential hypertension  -     Comprehensive Metabolic Panel; Future  -     hydroCHLOROthiazide (HYDRODIURIL) 25 MG tablet; TAKE 1 TABLET BY MOUTH ONCE DAILY IN THE MORNING FOR 90 DAYS, Disp-90 tablet, R-0Normal  3. Mixed hyperlipidemia  -     Lipid Panel; Future  -     atorvastatin (LIPITOR) 20 MG tablet; Take 1 tablet by mouth daily, Disp-90 tablet, R-0Normal  4. Iron deficiency  -     Iron; Future  -     Total Iron Binding Capacity; Future  -     Ferritin; Future  -     [facility]  5. Gastroesophageal reflux disease without esophagitis  -     omeprazole (PRILOSEC) 40 MG delayed release capsule; Take 1 capsule by mouth daily, Disp-90 capsule, R-0Normal      No results found for any visits on 09/30/21.     Return in about 4 weeks (around 10/28/2021).         Subjective   SUBJECTIVE/OBJECTIVE:  HPI    No Known Allergies  Current Outpatient Medications   Medication Sig Dispense Refill   . atorvastatin (LIPITOR) 20 MG tablet Take 1 tablet by mouth daily 90 tablet 0   . hydroCHLOROthiazide (HYDRODIURIL) 25 MG tablet TAKE 1 TABLET BY MOUTH ONCE DAILY IN THE MORNING FOR 90 DAYS 90 tablet 0   . hydroxyurea (HYDREA) 500 MG chemo capsule TAKE 3 CAPSULES BY MOUTH ONCE DAILY FOR 90 DAYS 270 capsule 0   . omeprazole (PRILOSEC) 40 MG delayed release capsule Take 1 capsule by mouth daily 90 capsule 0   . aspirin 81 MG chewable  tablet CHEW AND SWALLOW 1 TABLET BY MOUTH ONCE DAILY FOR 90 DAYS     . ferrous sulfate (IRON 325) 325 (65 Fe) MG tablet Take 325 mg by mouth daily (Patient not taking: Reported on 09/30/2021)       No current facility-administered medications for this visit.       Past Medical History:   Diagnosis Date   . Hypertension      History reviewed. No pertinent surgical history.  Social History     Tobacco Use   . Smoking status: Never   . Smokeless tobacco: Never   Substance Use Topics   . Alcohol use: Not Currently   . Drug use: Never      History reviewed. No pertinent family history.    Review of Systems           Objective     Vitals:    09/30/21 1336   BP: 130/76   Pulse: 82   Resp: 18   Temp: 98.7 F (37.1 C)   SpO2: 99%       Physical Exam  An electronic signature was used to authenticate this note.    --Alinda Dooms, PA

## 2021-10-01 LAB — CBC WITH AUTO DIFFERENTIAL
Basophils %: 1 % (ref 0.0–2.0)
Basophils Absolute: 0.1 10*3/uL (ref 0.0–0.2)
Eosinophils %: 1 % (ref 0.5–7.8)
Eosinophils Absolute: 0.1 10*3/uL (ref 0.0–0.8)
Hematocrit: 44 % (ref 41.1–50.3)
Hemoglobin: 14.3 g/dL (ref 13.6–17.2)
Immature Granulocytes %: 1 % (ref 0.0–5.0)
Immature Granulocytes Absolute: 0.1 10*3/uL (ref 0.0–0.5)
Lymphocytes %: 27 % (ref 13–44)
Lymphocytes Absolute: 2.3 10*3/uL (ref 0.5–4.6)
MCH: 32.1 PG (ref 26.1–32.9)
MCHC: 32.5 g/dL (ref 31.4–35.0)
MCV: 98.9 FL (ref 82–102)
MPV: 8.7 FL — ABNORMAL LOW (ref 9.4–12.3)
Monocytes %: 10 % (ref 4.0–12.0)
Monocytes Absolute: 0.9 10*3/uL (ref 0.1–1.3)
Neutrophils %: 60 % (ref 43–78)
Neutrophils Absolute: 5.2 10*3/uL (ref 1.7–8.2)
Platelets: 1024 10*3/uL (ref 150–450)
RBC: 4.45 M/uL (ref 4.23–5.6)
RDW: 17.9 % — ABNORMAL HIGH (ref 11.9–14.6)
WBC: 8.7 10*3/uL (ref 4.3–11.1)
nRBC: 0 10*3/uL (ref 0.0–0.2)

## 2021-10-17 ENCOUNTER — Ambulatory Visit: Admit: 2021-10-17 | Discharge: 2021-10-17 | Attending: Physician Assistant | Primary: Family Medicine

## 2021-10-17 DIAGNOSIS — J029 Acute pharyngitis, unspecified: Secondary | ICD-10-CM

## 2021-10-17 LAB — AMB POC STREP GO A DIRECT, DNA PROBE
Strep pyogenes DNA, POC: NEGATIVE
Valid Internal Control, POC: POSITIVE

## 2021-10-17 LAB — AMB POC COVID-19 COV: SARS-COV-2 RNA, POC: POSITIVE

## 2021-10-17 LAB — AMB POC INFLUENZA A  AND B REAL-TIME RT-PCR
Influenza A Antigen, POC: NEGATIVE
Influenza B Antigen, POC: NEGATIVE
Valid Internal Control, POC: POSITIVE

## 2021-10-17 MED ORDER — BENZONATATE 100 MG PO CAPS
100 MG | ORAL_CAPSULE | Freq: Three times a day (TID) | ORAL | 0 refills | Status: AC | PRN
Start: 2021-10-17 — End: 2021-10-22

## 2021-10-17 NOTE — Progress Notes (Signed)
NEW PATIENT INTAKE    Referral Diagnosis: Thrombocytosis; Iron deficiency    Referring Provider: Leonidas Romberg, PA    Primary Care Provider: Vincenza Hews, MD     Family History of Cancer/ Hematology Disorders: No known family history    Presenting Symptoms: Elevated platelet counts    Chronological History of Pertinent Events:     61 year old black male    09/30/21 - Met with PCP, Leonidas Romberg, PA  New patient consultation  Platelets elevated today - 1,024  Rx: Hydroxyurea sent to pharmacy  Anemia: Patient reports he has been off iron supplement since last year.  H/o GERD; Refill Rx: Prilosec sent to pharmacy  Referral placed with Hematology  Iron, TIBC, Ferritin were all WNL. (EPIC)    H/o platelet levels: (EPIC)   09/18/19 00:00 12/27/19 09:17 04/04/20 00:00 07/16/20 10:21 09/27/20 09:18 11/05/20 10:40   Platelet Count 523 (H) 522 (H) 784 (H) 544 (H) 293 573 (H)     10/17/21 - Met with PCP  c/o "very sore throat" x last 3 days, c/o cough, low-grade fever, chills, sinus congestion and pressure. Denies wheezing, SOB, chest pains, N/V/D or lethargy.  Today patient tested + for COVID-19.      A referral has been placed with [facility] for a Medical Hematology consultation and treatment.      Notes from Referring Provider: N/A    Presented at Tumor Board: No    Other Pertinent Information: N/A

## 2021-10-17 NOTE — Progress Notes (Signed)
James Hurst (DOB:  April 07, 1960) is a 61 y.o. male here for evaluation of the following chief complaint(s):  Pharyngitis (Patient presents for sore throat, cough, fever, chills, ear pain, states symptoms started on Wednesday. )         ASSESSMENT/PLAN:  1. Sore throat  Comments:  COvid +, on day 3 of symptoms. recommend salt water gargles, throat lozenges PRN  Orders:  -     AMB POC COVID-19 COV  -     AMB POC INFLUENZA A  AND B REAL-TIME RT-PCR  -     AMB POC STREP GO A DIRECT, DNA PROBE  2. Fever, unspecified fever cause  -     AMB POC COVID-19 COV  -     AMB POC INFLUENZA A  AND B REAL-TIME RT-PCR  -     AMB POC STREP GO A DIRECT, DNA PROBE  3. COVID-19  Comments:  recommend rest, fluids, symptomatic treatment. Advised f/u if symptoms persist >10 days, sooner if changing/worsening   Orders:  -     benzonatate (TESSALON) 100 MG capsule; Take 1 capsule by mouth 3 times daily as needed for Cough, Disp-15 capsule, R-0Normal      Results for orders placed or performed in visit on 10/17/21   AMB POC COVID-19 COV   Result Value Ref Range    SARS-COV-2 RNA, POC Positive     Lot number swab      EXP date swab      Lot number solution      EXP date solution      LOT NUMBER POC      EXPIRATION DATE     AMB POC INFLUENZA A  AND B REAL-TIME RT-PCR   Result Value Ref Range    Valid Internal Control, POC Positive     Influenza A Antigen, POC Negative Negative    Influenza B Antigen, POC Negative Negative   AMB POC STREP GO A DIRECT, DNA PROBE   Result Value Ref Range    Valid Internal Control, POC Positive     Strep pyogenes DNA, POC Negative Negative        No follow-ups on file.         Subjective   SUBJECTIVE/OBJECTIVE:  Pt here today for a sick visit c/o very sore throat for the last 3 days. He has also had cough, low grade fever, chills, sinus congestion and pressure. He has not had any wheezing, SOB, chest pains, N/V/D or lethargy.         No Known Allergies  Current Outpatient Medications   Medication Sig Dispense  Refill    benzonatate (TESSALON) 100 MG capsule Take 1 capsule by mouth 3 times daily as needed for Cough 15 capsule 0    atorvastatin (LIPITOR) 20 MG tablet Take 1 tablet by mouth daily 90 tablet 0    hydroCHLOROthiazide (HYDRODIURIL) 25 MG tablet TAKE 1 TABLET BY MOUTH ONCE DAILY IN THE MORNING FOR 90 DAYS 90 tablet 0    hydroxyurea (HYDREA) 500 MG chemo capsule TAKE 3 CAPSULES BY MOUTH ONCE DAILY FOR 90 DAYS 270 capsule 0    omeprazole (PRILOSEC) 40 MG delayed release capsule Take 1 capsule by mouth daily 90 capsule 0    aspirin 81 MG chewable tablet CHEW AND SWALLOW 1 TABLET BY MOUTH ONCE DAILY FOR 90 DAYS      ferrous sulfate (IRON 325) 325 (65 Fe) MG tablet Take 325 mg by mouth daily (Patient not taking: Reported on 10/17/2021)  No current facility-administered medications for this visit.       Past Medical History:   Diagnosis Date    Hypertension      History reviewed. No pertinent surgical history.  Social History     Tobacco Use    Smoking status: Never    Smokeless tobacco: Never   Substance Use Topics    Alcohol use: Not Currently    Drug use: Never      History reviewed. No pertinent family history.    Review of Systems   Constitutional:  Positive for chills, fatigue and fever. Negative for unexpected weight change.   HENT:  Positive for congestion, postnasal drip, rhinorrhea, sinus pressure and sore throat. Negative for ear discharge and sneezing.    Eyes:  Negative for pain.   Respiratory:  Positive for cough. Negative for shortness of breath, wheezing and stridor.    Cardiovascular:  Negative for chest pain.   Gastrointestinal:  Negative for abdominal pain, constipation, diarrhea, nausea and vomiting.   Genitourinary:  Negative for dysuria.   Skin:  Negative for rash.   Allergic/Immunologic: Negative for immunocompromised state.   Neurological:  Negative for dizziness, weakness and headaches.              Objective   Vitals:    10/17/21 1252   BP: (!) 140/78   Pulse:    Resp:    Temp:    SpO2:       Vitals:    10/17/21 0752 10/17/21 1252   BP: (!) 150/88 (!) 140/78   Site: Right Upper Arm    Position: Sitting    Cuff Size: Medium Adult    Pulse: 68    Resp: 18    Temp: 98 F (36.7 C)    SpO2: 99%    Weight: 179 lb (81.2 kg)    Height: 5' 10.5" (1.791 m)       Physical Exam  Vitals reviewed.   Constitutional:       Appearance: Normal appearance.   HENT:      Head: Normocephalic and atraumatic.      Right Ear: Tympanic membrane, ear canal and external ear normal.      Left Ear: Tympanic membrane, ear canal and external ear normal.      Nose: Congestion and rhinorrhea present.      Mouth/Throat:      Mouth: Mucous membranes are moist.      Pharynx: Oropharynx is clear.   Eyes:      Extraocular Movements: Extraocular movements intact.      Pupils: Pupils are equal, round, and reactive to light.   Cardiovascular:      Rate and Rhythm: Normal rate and regular rhythm.   Pulmonary:      Effort: Pulmonary effort is normal.      Breath sounds: Normal breath sounds.   Abdominal:      General: Bowel sounds are normal.      Palpations: Abdomen is soft.   Musculoskeletal:         General: Normal range of motion.      Cervical back: Normal range of motion and neck supple.   Skin:     General: Skin is warm and dry.   Neurological:      General: No focal deficit present.      Mental Status: He is alert and oriented to person, place, and time.   Psychiatric:         Mood and Affect: Mood normal.  Behavior: Behavior normal.         Judgment: Judgment normal.                  An electronic signature was used to authenticate this note.    --Leonidas Romberg, PA

## 2021-10-20 ENCOUNTER — Ambulatory Visit: Admit: 2021-10-20 | Discharge: 2021-10-20 | Attending: Hematology | Primary: Family Medicine

## 2021-10-20 ENCOUNTER — Inpatient Hospital Stay: Admit: 2021-10-20 | Primary: Family Medicine

## 2021-10-20 DIAGNOSIS — D471 Chronic myeloproliferative disease: Secondary | ICD-10-CM

## 2021-10-20 LAB — CBC WITH AUTO DIFFERENTIAL
Absolute Immature Granulocyte: 0.1 10*3/uL (ref 0.0–0.5)
Basophils %: 1 % (ref 0.0–2.0)
Basophils Absolute: 0 10*3/uL (ref 0.0–0.2)
Eosinophils %: 1 % (ref 0.5–7.8)
Eosinophils Absolute: 0.1 10*3/uL (ref 0.0–0.8)
Hematocrit: 43.5 % (ref 41.1–50.3)
Hemoglobin: 14.4 g/dL (ref 13.6–17.2)
Immature Granulocytes: 2 % (ref 0.0–5.0)
Lymphocytes %: 29 % (ref 13–44)
Lymphocytes Absolute: 1.5 10*3/uL (ref 0.5–4.6)
MCH: 32.1 PG (ref 26.1–32.9)
MCHC: 33.1 g/dL (ref 31.4–35.0)
MCV: 97.1 FL (ref 82.0–102.0)
MPV: 9 FL — ABNORMAL LOW (ref 9.4–12.3)
Monocytes %: 10 % (ref 4.0–12.0)
Monocytes Absolute: 0.5 10*3/uL (ref 0.1–1.3)
Neutrophils %: 57 % (ref 43–78)
Neutrophils Absolute: 3.1 10*3/uL (ref 1.7–8.2)
Platelets: 598 10*3/uL — ABNORMAL HIGH (ref 150–450)
RBC: 4.48 M/uL (ref 4.23–5.6)
RDW: 17.2 % — ABNORMAL HIGH (ref 11.9–14.6)
WBC: 5.4 10*3/uL (ref 4.3–11.1)
nRBC: 0 10*3/uL (ref 0.0–0.2)

## 2021-10-20 LAB — COMPREHENSIVE METABOLIC PANEL
ALT: 28 U/L (ref 12–65)
AST: 26 U/L (ref 15–37)
Albumin/Globulin Ratio: 1 (ref 0.4–1.6)
Albumin: 3.2 g/dL (ref 3.2–4.6)
Alk Phosphatase: 75 U/L (ref 50–136)
Anion Gap: 1 mmol/L — ABNORMAL LOW (ref 2–11)
BUN: 12 MG/DL (ref 8–23)
CO2: 32 mmol/L (ref 21–32)
Calcium: 8.6 MG/DL (ref 8.3–10.4)
Chloride: 109 mmol/L (ref 101–110)
Creatinine: 1 MG/DL (ref 0.8–1.5)
Est, Glom Filt Rate: >60 mL/min/{1.73_m2} (ref >60)
Globulin: 3.1 g/dL (ref 2.8–4.5)
Glucose: 117 mg/dL — ABNORMAL HIGH (ref 65–100)
Potassium: 4.1 mmol/L (ref 3.5–5.1)
Sodium: 142 mmol/L (ref 133–143)
Total Bilirubin: 0.9 MG/DL (ref 0.2–1.1)
Total Protein: 6.3 g/dL (ref 6.3–8.2)

## 2021-10-20 LAB — FERRITIN: Ferritin: 152 NG/ML (ref 8–388)

## 2021-10-20 LAB — VITAMIN B12: Vitamin B-12: 817 pg/mL (ref 193–986)

## 2021-10-20 NOTE — Progress Notes (Unsigned)
[address] [facility]  [address]  [place], [place] [address]  Phone: [phone]        10/20/2021  James Hurst  Dec 03, 1960  [id number]      James Hurst is a 61 year old African-American man who has been sent to my clinic by Dr. Lenn Sink. In 06/2013 he was diagnosed with Essential Thrombocytosis, CALR+, he is on Hydrea 1500 mg/day and ASA 81 mg/day.      ALLERGIES:    No known drug allergies.      FAMILY HISTORY:     No hematologic disorders.      SOCIAL HISTORY:    He is single and lives alone. He works in [employer], dispensing. He denies ever using any tobacco products.      PAST MEDICAL HISTORY:    Hypertension, GERD, Hyperlipidemia and Essential Thrombocytosis.      ROS:  The patient complained of fatigue; all other systems negative.       PHYSICAL EXAM:   The patient was alert, awake and oriented, no acute distress was noted. Oral examination did not reveal any mucosal lesions. Lymph node examination did not reveal any adenopathy. Neck examination revealed a supple neck, no thyromegaly or masses were noted. Chest examination revealed normal vesicular breath sounds. Heart examination revealed S-1 and S-2 without any murmurs. Abdominal examination revealed a non-tender abdomen, bowel sounds were positive, no organomegaly could be appreciated. Examination of the extremities did not reveal any tenderness or erythema. Examination of the skin did not reveal any lesions.      KPS:    100.      LABORATORY INVESTIGATIONS:  CBC showed a WBC count of 5.4, ANC was 3.1, Hemoglobin was 14.4 and Platelets were 598. Medical problems and test results were reviewed with the patient today.      ASSESSMENT:    MPN; Hyperlipidemia. I spent a total of 62 minutes on the day of the visit, managing the care of this patient.      PLAN:   He should continue taking Atorvastatin, Hydrea 1500 mg/day and ASA 81 mg/day; at his next clinic visit I will re-check his CBC and CMP.      Thank you for allowing me to  participate in the care of this patient.      Sharin Grave MD  Hematology/BMT

## 2021-10-20 NOTE — Patient Instructions (Addendum)
Patient Instructions from Today's Visit    Reason for Visit:  New Patient    Diagnosis Information:  https://www.cancer.net/about-us/asco-answers-patient-education-materials/asco-answers-fact-sheets    Plan:  You will have some blood work done today  Continue taking hydrea  Follow Up:  We will bring you back in November for a follow up with Dr.Khan and lab work prior.    Recent Lab Results:  Lab work done today downstairs    Treatment Summary has been discussed and given to patient: n/a        -------------------------------------------------------------------------------------------------------------------  Please call our office at [phone] if you have any  of the following symptoms:   Fever of 100.5 or greater  Chills  Shortness of breath  Swelling or pain in one leg    After office hours an answering service is available and will contact a provider for emergencies or if you are experiencing any of the above symptoms.    Patient did not express an interest in My Chart.  My Chart log in information explained on the after visit summary printout at the [facility] desk.    Howard Pouch, MA

## 2021-10-21 LAB — VITAMIN D 25 HYDROXY: Vit D, 25-Hydroxy: 24.6 ng/mL — ABNORMAL LOW (ref 30.0–100.0)

## 2021-10-27 LAB — IFE AND PE, SERUM
A/G Ratio: 1.5 (ref 0.7–1.7)
Albumin: 3.4 g/dL (ref 2.9–4.4)
Alpha 1 Globulin: 0.2 g/dL (ref 0.0–0.4)
Alpha 2 Globulin: 0.7 g/dL (ref 0.4–1.0)
Beta Globulin: 1 g/dL (ref 0.7–1.3)
GAMMA GLOBULIN: 0.5 g/dL (ref 0.4–1.8)
Globulin: 2.4 g/dL (ref 2.2–3.9)
IgA: 175 mg/dL (ref 90–386)
IgG, Serum: 545 mg/dL — ABNORMAL LOW (ref 603–1613)
IgM: 36 mg/dL (ref 20–172)
Total Protein: 5.8 g/dL — ABNORMAL LOW (ref 6.0–8.5)

## 2021-10-28 ENCOUNTER — Encounter: Attending: Physician Assistant | Primary: Family Medicine

## 2021-12-09 ENCOUNTER — Other Ambulatory Visit: Primary: Family Medicine

## 2021-12-09 ENCOUNTER — Encounter: Attending: Hematology | Primary: Family Medicine

## 2021-12-18 ENCOUNTER — Encounter

## 2021-12-19 ENCOUNTER — Inpatient Hospital Stay: Admit: 2021-12-19 | Primary: Family Medicine

## 2021-12-19 ENCOUNTER — Encounter: Admit: 2021-12-19 | Discharge: 2021-12-19 | Attending: Hematology | Primary: Family Medicine

## 2021-12-19 DIAGNOSIS — D471 Chronic myeloproliferative disease: Secondary | ICD-10-CM

## 2021-12-19 LAB — COMPREHENSIVE METABOLIC PANEL
ALT: 22 U/L (ref 12–65)
AST: 22 U/L (ref 15–37)
Albumin/Globulin Ratio: 1.3 (ref 0.4–1.6)
Albumin: 3.4 g/dL (ref 3.2–4.6)
Alk Phosphatase: 59 U/L (ref 50–136)
Anion Gap: 2 mmol/L (ref 2–11)
BUN: 17 MG/DL (ref 8–23)
CO2: 31 mmol/L (ref 21–32)
Calcium: 8.7 MG/DL (ref 8.3–10.4)
Chloride: 108 mmol/L (ref 101–110)
Creatinine: 1.2 MG/DL (ref 0.8–1.5)
Est, Glom Filt Rate: >60 mL/min/{1.73_m2} (ref >60)
Globulin: 2.6 g/dL — ABNORMAL LOW (ref 2.8–4.5)
Glucose: 123 mg/dL — ABNORMAL HIGH (ref 65–100)
Potassium: 4.2 mmol/L (ref 3.5–5.1)
Sodium: 141 mmol/L (ref 133–143)
Total Bilirubin: 1.6 MG/DL — ABNORMAL HIGH (ref 0.2–1.1)
Total Protein: 6 g/dL — ABNORMAL LOW (ref 6.3–8.2)

## 2021-12-19 LAB — CBC WITH AUTO DIFFERENTIAL
Basophils %: 1 % (ref 0.0–2.0)
Basophils Absolute: 0.1 10*3/uL (ref 0.0–0.2)
Eosinophils %: 2 % (ref 0.5–7.8)
Eosinophils Absolute: 0.1 10*3/uL (ref 0.0–0.8)
Hematocrit: 43.7 % (ref 41.1–50.3)
Hemoglobin: 15 g/dL (ref 13.6–17.2)
Immature Granulocytes %: 0 % (ref 0.0–5.0)
Immature Granulocytes Absolute: 0 10*3/uL (ref 0.0–0.5)
Lymphocytes %: 41 % (ref 13–44)
Lymphocytes Absolute: 1.9 10*3/uL (ref 0.5–4.6)
MCH: 35.2 PG — ABNORMAL HIGH (ref 26.1–32.9)
MCHC: 34.3 g/dL (ref 31.4–35.0)
MCV: 102.6 FL — ABNORMAL HIGH (ref 82.0–102.0)
MPV: 9.3 FL — ABNORMAL LOW (ref 9.4–12.3)
Monocytes %: 9 % (ref 4.0–12.0)
Monocytes Absolute: 0.4 10*3/uL (ref 0.1–1.3)
Neutrophils %: 47 % (ref 43–78)
Neutrophils Absolute: 2.2 10*3/uL (ref 1.7–8.2)
Platelets: 470 10*3/uL — ABNORMAL HIGH (ref 150–450)
RBC: 4.26 M/uL (ref 4.23–5.6)
RDW: 15.3 % — ABNORMAL HIGH (ref 11.9–14.6)
WBC: 4.8 10*3/uL (ref 4.3–11.1)
nRBC: 0 10*3/uL (ref 0.0–0.2)

## 2021-12-19 LAB — VITAMIN D 25 HYDROXY: Vit D, 25-Hydroxy: 18.1 ng/mL — ABNORMAL LOW (ref 30.0–100.0)

## 2021-12-19 MED ORDER — VITAMIN D (ERGOCALCIFEROL) 1.25 MG (50000 UT) PO CAPS
1.2550000 MG (50000 UT) | ORAL_CAPSULE | Freq: Every day | ORAL | 0 refills | Status: AC
Start: 2021-12-19 — End: 2022-03-27
  Filled 2021-12-19: qty 14, 14d supply, fill #0

## 2021-12-19 MED ORDER — HYDROXYUREA 500 MG PO CAPS
500 MG | ORAL_CAPSULE | Freq: Three times a day (TID) | ORAL | 6 refills | Status: DC
Start: 2021-12-19 — End: 2022-03-27

## 2021-12-19 NOTE — Patient Instructions (Signed)
Patient Instructions from Today's Visit    Reason for Visit:  Follow up    Diagnosis Information:  https://www.cancer.net/about-us/asco-answers-patient-education-materials/asco-answers-fact-sheets    Plan:  Lab work looks stable today  We will send in another course of prescription Vitamin D 50,000 units    -when you finish the prescription, start taking over the counter Vitamin D 1,000 units    Follow Up:  We will bring you back in January for a follow up with Dr.Khan on lab work prior.    Recent Lab Results:  Hospital Outpatient Visit on 12/19/2021   Component Date Value Ref Range Status    WBC 12/19/2021 4.8  4.3 - 11.1 K/uL Final    RBC 12/19/2021 4.26  4.23 - 5.6 M/uL Final    Hemoglobin 12/19/2021 15.0  13.6 - 17.2 g/dL Final    Hematocrit 12/19/2021 43.7  41.1 - 50.3 % Final    MCV 12/19/2021 102.6 (H)  82.0 - 102.0 FL Final    MCH 12/19/2021 35.2 (H)  26.1 - 32.9 PG Final    MCHC 12/19/2021 34.3  31.4 - 35.0 g/dL Final    RDW 12/19/2021 15.3 (H)  11.9 - 14.6 % Final    Platelets 12/19/2021 470 (H)  150 - 450 K/uL Final    MPV 12/19/2021 9.3 (L)  9.4 - 12.3 FL Final    nRBC 12/19/2021 0.00  0.0 - 0.2 K/uL Final    **Note: Absolute NRBC parameter is now reported with Hemogram**    Differential Type 12/19/2021 AUTOMATED    Final    Neutrophils % 12/19/2021 47  43 - 78 % Final    Lymphocytes % 12/19/2021 41  13 - 44 % Final    Monocytes % 12/19/2021 9  4.0 - 12.0 % Final    Eosinophils % 12/19/2021 2  0.5 - 7.8 % Final    Basophils % 12/19/2021 1  0.0 - 2.0 % Final    Immature Granulocytes 12/19/2021 0  0.0 - 5.0 % Final    Neutrophils Absolute 12/19/2021 2.2  1.7 - 8.2 K/UL Final    Lymphocytes Absolute 12/19/2021 1.9  0.5 - 4.6 K/UL Final    Monocytes Absolute 12/19/2021 0.4  0.1 - 1.3 K/UL Final    Eosinophils Absolute 12/19/2021 0.1  0.0 - 0.8 K/UL Final    Basophils Absolute 12/19/2021 0.1  0.0 - 0.2 K/UL Final    Absolute Immature Granulocyte 12/19/2021 0.0  0.0 - 0.5 K/UL Final    Sodium 12/19/2021 141   133 - 143 mmol/L Final    Potassium 12/19/2021 4.2  3.5 - 5.1 mmol/L Final    Chloride 12/19/2021 108  101 - 110 mmol/L Final    CO2 12/19/2021 31  21 - 32 mmol/L Final    Anion Gap 12/19/2021 2  2 - 11 mmol/L Final    Glucose 12/19/2021 123 (H)  65 - 100 mg/dL Final    BUN 12/19/2021 17  8 - 23 MG/DL Final    Creatinine 12/19/2021 1.20  0.8 - 1.5 MG/DL Final    Est, Glom Filt Rate 12/19/2021 >60  >60 ml/min/1.73m Final    Comment:    Pediatric calculator link: https://www.kidney.org/professionals/kdoqi/gfr_calculatorped     These results are not intended for use in patients <131years of age.     eGFR results are calculated without a race factor using  the 2021 CKD-EPI equation. Careful clinical correlation is recommended, particularly when comparing to results calculated using previous equations.  The CKD-EPI equation is less accurate  in patients with extremes of muscle mass, extra-renal metabolism of creatinine, excessive creatine ingestion, or following therapy that affects renal tubular secretion.      Calcium 12/19/2021 8.7  8.3 - 10.4 MG/DL Final    Total Bilirubin 12/19/2021 1.6 (H)  0.2 - 1.1 MG/DL Final    ALT 12/19/2021 22  12 - 65 U/L Final    AST 12/19/2021 22  15 - 37 U/L Final    Alk Phosphatase 12/19/2021 59  50 - 136 U/L Final    Total Protein 12/19/2021 6.0 (L)  6.3 - 8.2 g/dL Final    Albumin 12/19/2021 3.4  3.2 - 4.6 g/dL Final    Globulin 12/19/2021 2.6 (L)  2.8 - 4.5 g/dL Final    Albumin/Globulin Ratio 12/19/2021 1.3  0.4 - 1.6   Final    Vit D, 25-Hydroxy 12/19/2021 18.1 (L)  30.0 - 100.0 ng/mL Final         Treatment Summary has been discussed and given to patient: n/a        -------------------------------------------------------------------------------------------------------------------  Please call our office at [phone] if you have any  of the following symptoms:   Fever of 100.5 or greater  Chills  Shortness of breath  Swelling or pain in one leg    After office hours an answering  service is available and will contact a provider for emergencies or if you are experiencing any of the above symptoms.    Patient did not express an interest in My Chart.  My Chart log in information explained on the after visit summary printout at the [facility] desk.    Howard Pouch, MA

## 2021-12-19 NOTE — Progress Notes (Signed)
[place] [address]  Phone: [phone]           12/19/2021  James Hurst  [birth year]  [id number]        James Hurst is a 61 year old African-American man who has returned to my clinic for a follow-up visit; he was initially referred to me by Dr. Mickle Plumb. In 06/2013 he was diagnosed with Essential Thrombocytosis, CALR+, he is on Hydrea 1500 mg/day and ASA 81 mg/day.        ALLERGIES:    No known drug allergies.        FAMILY HISTORY:     No hematologic disorders.        SOCIAL HISTORY:    He is single and lives alone. He works in Multimedia programmer. He denies ever using any tobacco products.        PAST MEDICAL HISTORY:    Hypertension, GERD, Hyperlipidemia, Vitamin D deficiency and Essential Thrombocytosis.        ROS:  The patient complained of fatigue; all other systems negative.        PHYSICAL EXAM:   The patient was alert, awake and oriented, no acute distress was noted. Oral examination did not reveal any mucosal lesions. Lymph node examination did not reveal any adenopathy. Neck examination revealed a supple neck, no thyromegaly or masses were noted. Chest examination revealed normal vesicular breath sounds. Heart examination revealed S-1 and S-2 without any murmurs. Abdominal examination revealed a non-tender abdomen, bowel sounds were positive, no organomegaly could be appreciated. Examination of the extremities did not reveal any tenderness or erythema. Examination of the skin did not reveal any lesions.        KPS:    100.        LABORATORY INVESTIGATIONS:  CBC showed a WBC count of 4.8, ANC was 2.2, Hemoglobin was 15.1, MCV was 102 and Platelets were 470. Medical problems and test results were reviewed with the patient today.        ASSESSMENT:    MPN; Hyperlipidemia; Vitamin D deficiency. I spent a total of 42 minutes on the day of the visit, managing the care of this patient.        PLAN:   He should continue taking Atorvastatin,  supplemental Vitamin D, Hydrea 1500 mg/day and ASA 81 mg/day; at his next clinic visit I will re-check his CBC, CMP and Vitamin D level.        Floyde Parkins MD  Hematology/BMT

## 2022-02-03 MED ORDER — HYDROCHLOROTHIAZIDE 25 MG PO TABS
25 MG | ORAL_TABLET | ORAL | 0 refills | Status: DC
Start: 2022-02-03 — End: 2022-03-20

## 2022-02-03 MED ORDER — ATORVASTATIN CALCIUM 20 MG PO TABS
20 MG | ORAL_TABLET | Freq: Every day | ORAL | 0 refills | Status: DC
Start: 2022-02-03 — End: 2022-03-20

## 2022-02-03 MED ORDER — OMEPRAZOLE 40 MG PO CPDR
40 MG | ORAL_CAPSULE | Freq: Every day | ORAL | 0 refills | Status: DC
Start: 2022-02-03 — End: 2022-03-20

## 2022-02-06 ENCOUNTER — Ambulatory Visit: Attending: Physician Assistant | Primary: Family Medicine

## 2022-02-06 DIAGNOSIS — E782 Mixed hyperlipidemia: Secondary | ICD-10-CM

## 2022-02-09 NOTE — Progress Notes (Signed)
Encounter created in error

## 2022-02-19 ENCOUNTER — Encounter

## 2022-02-20 ENCOUNTER — Other Ambulatory Visit: Primary: Family Medicine

## 2022-02-20 ENCOUNTER — Encounter: Attending: Hematology | Primary: Family Medicine

## 2022-02-20 NOTE — Telephone Encounter (Signed)
Physician provider: Floyde Parkins, MD  Reason for today's call: med refill   Last office visit:12/19/2021    Patient notified that their information will be routed to the [facility] clinical triage team for review. Patient is advised that they will receive a phone call from the triage department. I      Medication:Hydrea 500 Mg      Pharmacy: [facility] on [address]

## 2022-02-20 NOTE — Telephone Encounter (Signed)
Patient informed refills are on current prescription.  Verbalized understanding.

## 2022-02-20 NOTE — Telephone Encounter (Signed)
Documentation Only     PT  came into our office today 1/19.    PT was under the impression he had an appt.  Per PT  I r/s his appt to 03/10/22 per pt request.

## 2022-02-24 ENCOUNTER — Other Ambulatory Visit: Primary: Family Medicine

## 2022-02-24 ENCOUNTER — Encounter: Attending: Hematology | Primary: Family Medicine

## 2022-03-09 ENCOUNTER — Encounter

## 2022-03-10 ENCOUNTER — Ambulatory Visit: Admit: 2022-03-10 | Attending: Hematology | Primary: Family Medicine

## 2022-03-10 ENCOUNTER — Inpatient Hospital Stay: Admit: 2022-03-10 | Primary: Family Medicine

## 2022-03-10 DIAGNOSIS — D471 Chronic myeloproliferative disease: Secondary | ICD-10-CM

## 2022-03-10 LAB — COMPREHENSIVE METABOLIC PANEL
ALT: 27 U/L (ref 12–65)
AST: 21 U/L (ref 15–37)
Albumin/Globulin Ratio: 1.4 (ref 0.4–1.6)
Albumin: 3.4 g/dL (ref 3.2–4.6)
Alk Phosphatase: 53 U/L (ref 50–136)
Anion Gap: 1 mmol/L — ABNORMAL LOW (ref 2–11)
BUN: 13 MG/DL (ref 8–23)
CO2: 29 mmol/L (ref 21–32)
Calcium: 8.9 MG/DL (ref 8.3–10.4)
Chloride: 112 mmol/L (ref 103–113)
Creatinine: 1.2 MG/DL (ref 0.8–1.5)
Est, Glom Filt Rate: >60 mL/min/{1.73_m2} (ref >60)
Globulin: 2.4 g/dL — ABNORMAL LOW (ref 2.8–4.5)
Glucose: 111 mg/dL — ABNORMAL HIGH (ref 65–100)
Potassium: 3.9 mmol/L (ref 3.5–5.1)
Sodium: 142 mmol/L (ref 136–146)
Total Bilirubin: 0.7 MG/DL (ref 0.2–1.1)
Total Protein: 5.8 g/dL — ABNORMAL LOW (ref 6.3–8.2)

## 2022-03-10 LAB — CBC WITH AUTO DIFFERENTIAL
Basophils %: 1 % (ref 0.0–2.0)
Basophils Absolute: 0 10*3/uL (ref 0.0–0.2)
Eosinophils %: 2 % (ref 0.5–7.8)
Eosinophils Absolute: 0.1 10*3/uL (ref 0.0–0.8)
Hematocrit: 40.4 % — ABNORMAL LOW (ref 41.1–50.3)
Hemoglobin: 14 g/dL (ref 13.6–17.2)
Immature Granulocytes %: 1 % (ref 0.0–5.0)
Immature Granulocytes Absolute: 0 10*3/uL (ref 0.0–0.5)
Lymphocytes %: 42 % (ref 13–44)
Lymphocytes Absolute: 1.6 10*3/uL (ref 0.5–4.6)
MCH: 37.1 PG — ABNORMAL HIGH (ref 26.1–32.9)
MCHC: 34.7 g/dL (ref 31.4–35.0)
MCV: 107.2 FL — ABNORMAL HIGH (ref 82.0–102.0)
MPV: 9 FL — ABNORMAL LOW (ref 9.4–12.3)
Monocytes %: 11 % (ref 4.0–12.0)
Monocytes Absolute: 0.4 10*3/uL (ref 0.1–1.3)
Neutrophils %: 43 % (ref 43–78)
Neutrophils Absolute: 1.7 10*3/uL (ref 1.7–8.2)
Platelets: 344 10*3/uL (ref 150–450)
RBC: 3.77 M/uL — ABNORMAL LOW (ref 4.23–5.6)
RDW: 14.5 % (ref 11.9–14.6)
WBC: 3.7 10*3/uL — ABNORMAL LOW (ref 4.3–11.1)
nRBC: 0 10*3/uL (ref 0.0–0.2)

## 2022-03-10 LAB — VITAMIN D 25 HYDROXY: Vit D, 25-Hydroxy: 28.3 ng/mL — ABNORMAL LOW (ref 30.0–100.0)

## 2022-03-10 MED ORDER — VITAMIN D (ERGOCALCIFEROL) 1.25 MG (50000 UT) PO CAPS
1.2550000 MG (50000 UT) | ORAL_CAPSULE | Freq: Every day | ORAL | 0 refills | Status: AC
Start: 2022-03-10 — End: 2022-03-24

## 2022-03-10 NOTE — Progress Notes (Signed)
[place] [address]  Phone: [phone]           03/10/2022  James Hurst  01/05/1961  [id number]        James Hurst is a 62 year old African-American man who has returned to my clinic for a follow-up visit; he was initially referred to me by Dr. Mickle Plumb. In 06/2013 he was diagnosed with Essential Thrombocytosis, CALR+, he is on Hydrea 1500 mg/day and ASA 81 mg/day.        ALLERGIES:    No known drug allergies.        FAMILY HISTORY:     No hematologic disorders.        SOCIAL HISTORY:    He is single and lives alone. He works in Multimedia programmer. He denies ever using any tobacco products.        PAST MEDICAL HISTORY:    Hypertension, GERD, Hyperlipidemia, Vitamin D deficiency and Essential Thrombocytosis.        ROS:  The patient complained of fatigue; all other systems negative.        PHYSICAL EXAM:   The patient was alert, awake and oriented, no acute distress was noted. Oral examination did not reveal any mucosal lesions. Lymph node examination did not reveal any adenopathy. Neck examination revealed a supple neck, no thyromegaly or masses were noted. Chest examination revealed normal vesicular breath sounds. Heart examination revealed S-1 and S-2 without any murmurs. Abdominal examination revealed a non-tender abdomen, bowel sounds were positive, no organomegaly could be appreciated. Examination of the extremities did not reveal any tenderness or erythema. Examination of the skin did not reveal any lesions.        KPS:    100.        LABORATORY INVESTIGATIONS:  CBC showed a WBC count of 3.7, ANC was 1.7, Hemoglobin was 14.1, MCV was 107 and Platelets were 344. Medical problems and test results were reviewed with the patient today.        ASSESSMENT:    MPN; Hyperlipidemia; Vitamin D deficiency. I spent a total of 42 minutes on the day of the visit, managing the care of this patient.        PLAN:   He should continue taking Atorvastatin,  supplemental Vitamin D, Hydrea 1500 mg/day and ASA 81 mg/day; at his next clinic visit I will re-check his CBC, CMP and Vitamin D level.        Floyde Parkins MD  Hematology/BMT

## 2022-03-10 NOTE — Patient Instructions (Addendum)
Patient Instructions from Today's Visit    Reason for Visit:  Follow Up    Diagnosis Information:  https://www.cancer.net/about-us/asco-answers-patient-education-materials/asco-answers-fact-sheets    Plan:  Lab work looks stable today  Continue taking Hydrea as directed  Start taking 2 Vitamin D pills daily    Follow Up:  We will bring you back in May for a follow up with Dr.Khan and lab work prior.    Recent Lab Results:  Hospital Outpatient Visit on 03/10/2022   Component Date Value Ref Range Status    Sodium 03/10/2022 142  136 - 146 mmol/L Final    RESULTS CHECKED X 2    Potassium 03/10/2022 3.9  3.5 - 5.1 mmol/L Final    RESULTS CHECKED X 2    Chloride 03/10/2022 112  103 - 113 mmol/L Final    RESULTS CHECKED X 2    CO2 03/10/2022 29  21 - 32 mmol/L Final    RESULTS CHECKED X 2    Anion Gap 03/10/2022 1 (L)  2 - 11 mmol/L Final    RESULTS CHECKED X 2    Glucose 03/10/2022 111 (H)  65 - 100 mg/dL Final    BUN 03/10/2022 13  8 - 23 MG/DL Final    Creatinine 03/10/2022 1.20  0.8 - 1.5 MG/DL Final    Est, Glom Filt Rate 03/10/2022 >60  >60 ml/min/1.22m Final    Comment:    Pediatric calculator link: https://www.kidney.org/professionals/kdoqi/gfr_calculatorped     These results are not intended for use in patients <16years of age.     eGFR results are calculated without a race factor using  the 2021 CKD-EPI equation. Careful clinical correlation is recommended, particularly when comparing to results calculated using previous equations.  The CKD-EPI equation is less accurate in patients with extremes of muscle mass, extra-renal metabolism of creatinine, excessive creatine ingestion, or following therapy that affects renal tubular secretion.      Calcium 03/10/2022 8.9  8.3 - 10.4 MG/DL Final    Total Bilirubin 03/10/2022 0.7  0.2 - 1.1 MG/DL Final    ALT 03/10/2022 27  12 - 65 U/L Final    AST 03/10/2022 21  15 - 37 U/L Final    Alk Phosphatase 03/10/2022 53  50 - 136 U/L Final    Total Protein 03/10/2022 5.8 (L)   6.3 - 8.2 g/dL Final    Albumin 03/10/2022 3.4  3.2 - 4.6 g/dL Final    Globulin 03/10/2022 2.4 (L)  2.8 - 4.5 g/dL Final    Albumin/Globulin Ratio 03/10/2022 1.4  0.4 - 1.6   Final    WBC 03/10/2022 3.7 (L)  4.3 - 11.1 K/uL Final    RBC 03/10/2022 3.77 (L)  4.23 - 5.6 M/uL Final    Hemoglobin 03/10/2022 14.0  13.6 - 17.2 g/dL Final    Hematocrit 03/10/2022 40.4 (L)  41.1 - 50.3 % Final    MCV 03/10/2022 107.2 (H)  82.0 - 102.0 FL Final    MCH 03/10/2022 37.1 (H)  26.1 - 32.9 PG Final    MCHC 03/10/2022 34.7  31.4 - 35.0 g/dL Final    RDW 03/10/2022 14.5  11.9 - 14.6 % Final    Platelets 03/10/2022 344  150 - 450 K/uL Final    MPV 03/10/2022 9.0 (L)  9.4 - 12.3 FL Final    nRBC 03/10/2022 0.00  0.0 - 0.2 K/uL Final    **Note: Absolute NRBC parameter is now reported with Hemogram**    Neutrophils % 03/10/2022 43  43 - 78 % Final    Lymphocytes % 03/10/2022 42  13 - 44 % Final    Monocytes % 03/10/2022 11  4.0 - 12.0 % Final    Eosinophils % 03/10/2022 2  0.5 - 7.8 % Final    Basophils % 03/10/2022 1  0.0 - 2.0 % Final    Immature Granulocytes 03/10/2022 1  0.0 - 5.0 % Final    Neutrophils Absolute 03/10/2022 1.7  1.7 - 8.2 K/UL Final    Lymphocytes Absolute 03/10/2022 1.6  0.5 - 4.6 K/UL Final    Monocytes Absolute 03/10/2022 0.4  0.1 - 1.3 K/UL Final    Eosinophils Absolute 03/10/2022 0.1  0.0 - 0.8 K/UL Final    Basophils Absolute 03/10/2022 0.0  0.0 - 0.2 K/UL Final    Absolute Immature Granulocyte 03/10/2022 0.0  0.0 - 0.5 K/UL Final    Differential Type 03/10/2022 AUTOMATED    Final             -------------------------------------------------------------------------------------------------------------------  Please call our office at [phone] if you have any  of the following symptoms:   Fever of 100.5 or greater  Chills  Shortness of breath  Swelling or pain in one leg    After office hours an answering service is available and will contact a provider for emergencies or if you are experiencing any of the  above symptoms.    Patient did not express an interest in My Chart.  My Chart log in information explained on the after visit summary printout at the [facility] desk.    Howard Pouch, MA

## 2022-03-20 ENCOUNTER — Ambulatory Visit: Admit: 2022-03-20 | Discharge: 2022-03-20 | Attending: Physician Assistant | Primary: Family Medicine

## 2022-03-20 DIAGNOSIS — I10 Essential (primary) hypertension: Secondary | ICD-10-CM

## 2022-03-20 MED ORDER — HYDROCHLOROTHIAZIDE 25 MG PO TABS
25 | ORAL_TABLET | ORAL | 1 refills | Status: AC
Start: 2022-03-20 — End: ?

## 2022-03-20 MED ORDER — OMEPRAZOLE 40 MG PO CPDR
40 | ORAL_CAPSULE | Freq: Every day | ORAL | 5 refills | Status: AC
Start: 2022-03-20 — End: ?

## 2022-03-20 MED ORDER — ATORVASTATIN CALCIUM 20 MG PO TABS
20 | ORAL_TABLET | Freq: Every day | ORAL | 5 refills | Status: AC
Start: 2022-03-20 — End: ?

## 2022-03-20 NOTE — Progress Notes (Signed)
James Hurst (DOB:  1961/01/14) is a 62 y.o. male here for evaluation of the following chief complaint(s):  Follow-up (Patient presents for a follow up, has seen hematology) and Medication Refill (Patient is requesting refills on all of his medication, hctz, atorvastin, omeprazole and vit d if he still need to take it)         ASSESSMENT/PLAN:  1. Essential hypertension  Comments:  COntrolled, refills sent.   Orders:  -     hydroCHLOROthiazide (HYDRODIURIL) 25 MG tablet; TAKE 1 TABLET BY MOUTH ONCE DAILY IN THE MORNING FOR 90 DAYS, Disp-90 tablet, R-1Normal  2. Vitamin D deficiency  Comments:  took high dose daily for 2 weeks, then told to just take OTC vit D so that is what he is doing now.  3. Thrombocytosis  Comments:  as per hematology- next visit with them in May and he will be getting labs done at this time as well   4. Mixed hyperlipidemia  -     atorvastatin (LIPITOR) 20 MG tablet; Take 1 tablet by mouth daily, Disp-30 tablet, R-5Normal  5. Gastroesophageal reflux disease without esophagitis  -     omeprazole (PRILOSEC) 40 MG delayed release capsule; Take 1 capsule by mouth daily, Disp-30 capsule, R-5Normal      No results found for any visits on 03/20/22.     Return in about 6 months (around 09/18/2022).         Subjective   SUBJECTIVE/OBJECTIVE:  Pt here today for f/u and med refills. Feels well and has no symptoms or concerns. He says he is starting a new job today and is in a hurry to get to work.       No Known Allergies  Current Outpatient Medications   Medication Sig Dispense Refill   . atorvastatin (LIPITOR) 20 MG tablet Take 1 tablet by mouth daily 30 tablet 5   . hydroCHLOROthiazide (HYDRODIURIL) 25 MG tablet TAKE 1 TABLET BY MOUTH ONCE DAILY IN THE MORNING FOR 90 DAYS 90 tablet 1   . omeprazole (PRILOSEC) 40 MG delayed release capsule Take 1 capsule by mouth daily 30 capsule 5   . vitamin D (ERGOCALCIFEROL) 1.25 MG (50000 UT) CAPS capsule Take 1 capsule by mouth daily for 14 days 14 capsule 0   .  hydroxyurea (HYDREA) 500 MG chemo capsule TAKE 3 CAPSULES BY MOUTH ONCE DAILY FOR 90 DAYS 270 capsule 0   . aspirin 81 MG chewable tablet CHEW AND SWALLOW 1 TABLET BY MOUTH ONCE DAILY FOR 90 DAYS     . vitamin D (ERGOCALCIFEROL) 1.25 MG (50000 UT) CAPS capsule Take 1 capsule by mouth daily for 14 days 14 capsule 0   . hydroxyurea (HYDREA) 500 MG chemo capsule Take 1 capsule by mouth 3 times daily (Patient not taking: Reported on 03/10/2022) 90 capsule 6   . ferrous sulfate (IRON 325) 325 (65 Fe) MG tablet Take 1 tablet by mouth daily (Patient not taking: Reported on 03/20/2022)       No current facility-administered medications for this visit.       Past Medical History:   Diagnosis Date   . Hypertension      History reviewed. No pertinent surgical history.  Social History     Tobacco Use   . Smoking status: Never   . Smokeless tobacco: Never   Substance Use Topics   . Alcohol use: Not Currently   . Drug use: Never      History reviewed. No pertinent family history.  Review of Systems   Constitutional:  Negative for chills, fatigue, fever and unexpected weight change.   HENT:  Negative for congestion and sore throat.    Eyes:  Negative for pain and visual disturbance.   Respiratory:  Negative for shortness of breath.    Cardiovascular:  Negative for chest pain.   Gastrointestinal:  Negative for abdominal pain, constipation, diarrhea and vomiting.   Endocrine: Negative for polydipsia, polyphagia and polyuria.   Genitourinary:  Negative for dysuria.   Skin:  Negative for rash.   Allergic/Immunologic: Negative for immunocompromised state.   Neurological:  Negative for dizziness, weakness, light-headedness and headaches.              Objective   Vitals:    03/20/22 0812   BP: (!) 123/92   Pulse:    Resp:    Temp:    SpO2:      Vitals:    03/20/22 0802 03/20/22 0812   BP: (!) 126/90 (!) 123/92   Site: Right Upper Arm Left Upper Arm   Position: Sitting Sitting   Cuff Size: Medium Adult Medium Adult   Pulse: 94    Resp: 18     Temp: 97.6 F (36.4 C)    SpO2: 98%    Weight: 84.8 kg (187 lb)    Height: 1.791 m (5' 10.5")       Physical Exam  Vitals reviewed.   Constitutional:       Appearance: Normal appearance.   HENT:      Head: Normocephalic and atraumatic.      Right Ear: Tympanic membrane, ear canal and external ear normal.      Left Ear: Tympanic membrane, ear canal and external ear normal.      Nose: Nose normal.      Mouth/Throat:      Mouth: Mucous membranes are moist.      Pharynx: Oropharynx is clear.   Eyes:      Extraocular Movements: Extraocular movements intact.      Pupils: Pupils are equal, round, and reactive to light.   Cardiovascular:      Rate and Rhythm: Normal rate and regular rhythm.   Pulmonary:      Effort: Pulmonary effort is normal.      Breath sounds: Normal breath sounds.   Abdominal:      General: Bowel sounds are normal.      Palpations: Abdomen is soft.   Musculoskeletal:         General: Normal range of motion.      Cervical back: Normal range of motion and neck supple.   Skin:     General: Skin is warm and dry.   Neurological:      General: No focal deficit present.      Mental Status: He is alert and oriented to person, place, and time.   Psychiatric:         Mood and Affect: Mood normal.         Behavior: Behavior normal.         Judgment: Judgment normal.                An electronic signature was used to authenticate this note.    --Leonidas Romberg, PA

## 2022-03-27 ENCOUNTER — Ambulatory Visit: Admit: 2022-03-27 | Discharge: 2022-03-27 | Attending: Physician Assistant | Primary: Family Medicine

## 2022-03-27 DIAGNOSIS — K529 Noninfective gastroenteritis and colitis, unspecified: Secondary | ICD-10-CM

## 2022-03-27 LAB — AMB POC COVID-19 COV: SARS-COV-2 RNA, POC: NEGATIVE

## 2022-03-27 LAB — AMB POC STREP GO A DIRECT, DNA PROBE
Strep pyogenes DNA, POC: NEGATIVE
Valid Internal Control, POC: POSITIVE

## 2022-03-27 LAB — AMB POC INFLUENZA A  AND B REAL-TIME RT-PCR
Influenza A Antigen, POC: NEGATIVE
Influenza B Antigen, POC: NEGATIVE
Valid Internal Control, POC: POSITIVE

## 2022-03-27 NOTE — Progress Notes (Signed)
James Hurst (DOB:  06-12-1960) is a 62 y.o. male here for evaluation of the following chief complaint(s):  Diarrhea (Patient presents for a sick appointment states he's been having diarrhea, was having chills, felt feverish, and also headaches, mild cough, all of these symptoms started yesterday.)         ASSESSMENT/PLAN:  1. Gastroenteritis  Comments:  BRAT diet, rest, fluids all discussed with patient , to f/u if not fully resolved within 5 days, sooner if worsening   2. Chills  -     AMB POC INFLUENZA A  AND B REAL-TIME RT-PCR  -     AMB POC STREP GO A DIRECT, DNA PROBE  -     AMB POC COVID-19 COV  3. Diarrhea, unspecified type  Comments:  covid, flu, strep all negative   Orders:  -     AMB POC INFLUENZA A  AND B REAL-TIME RT-PCR  -     AMB POC STREP GO A DIRECT, DNA PROBE  -     AMB POC COVID-19 COV      Results for orders placed or performed in visit on 03/27/22   AMB POC INFLUENZA A  AND B REAL-TIME RT-PCR   Result Value Ref Range    Valid Internal Control, POC Positive     Influenza A Antigen, POC Negative     Influenza B Antigen, POC Negative    AMB POC STREP GO A DIRECT, DNA PROBE   Result Value Ref Range    Valid Internal Control, POC Positive     Strep pyogenes DNA, POC Negative    AMB POC COVID-19 COV   Result Value Ref Range    SARS-COV-2 RNA, POC Negative     Lot number swab      EXP date swab      Lot number solution      EXP date solution      LOT NUMBER POC      EXPIRATION DATE          Return if symptoms worsen or fail to improve.         Subjective   SUBJECTIVE/OBJECTIVE:  Pt states diarrhea, chills, stomach cramps and headache started yesterday. Works says he has to have a note to return. He is feeling better today but still not 100%     Diarrhea   This is a new problem. The current episode started yesterday. The problem occurs 2 to 4 times per day. The problem has been gradually improving. The patient states that diarrhea does not awaken him from sleep. Associated symptoms include chills and  headaches. Pertinent negatives include no abdominal pain, fever or vomiting. There is no history of bowel resection, inflammatory bowel disease, irritable bowel syndrome, malabsorption, a recent abdominal surgery or short gut syndrome.       No Known Allergies  Current Outpatient Medications   Medication Sig Dispense Refill    atorvastatin (LIPITOR) 20 MG tablet Take 1 tablet by mouth daily 30 tablet 5    hydroCHLOROthiazide (HYDRODIURIL) 25 MG tablet TAKE 1 TABLET BY MOUTH ONCE DAILY IN THE MORNING FOR 90 DAYS 90 tablet 1    omeprazole (PRILOSEC) 40 MG delayed release capsule Take 1 capsule by mouth daily 30 capsule 5    vitamin D (ERGOCALCIFEROL) 1.25 MG (50000 UT) CAPS capsule Take 1 capsule by mouth daily for 14 days 14 capsule 0    vitamin D (ERGOCALCIFEROL) 1.25 MG (50000 UT) CAPS capsule Take 1 capsule by mouth daily  for 14 days 14 capsule 0    hydroxyurea (HYDREA) 500 MG chemo capsule TAKE 3 CAPSULES BY MOUTH ONCE DAILY FOR 90 DAYS 270 capsule 0    aspirin 81 MG chewable tablet CHEW AND SWALLOW 1 TABLET BY MOUTH ONCE DAILY FOR 90 DAYS       No current facility-administered medications for this visit.       Past Medical History:   Diagnosis Date    Hypertension      History reviewed. No pertinent surgical history.  Social History     Tobacco Use    Smoking status: Never    Smokeless tobacco: Never   Substance Use Topics    Alcohol use: Not Currently    Drug use: Never      History reviewed. No pertinent family history.    Review of Systems   Constitutional:  Positive for chills and fatigue. Negative for fever and unexpected weight change.   HENT:  Negative for congestion and sore throat.    Eyes:  Negative for pain.   Respiratory:  Negative for shortness of breath.    Cardiovascular:  Negative for chest pain.   Gastrointestinal:  Positive for diarrhea. Negative for abdominal distention, abdominal pain, constipation and vomiting.   Genitourinary:  Negative for dysuria.   Skin:  Negative for rash.    Allergic/Immunologic: Negative for immunocompromised state.   Neurological:  Positive for headaches. Negative for dizziness and weakness.              Objective   Vitals:    03/27/22 1230   BP: 121/80   Pulse: 73   Resp: 18   Temp: 98.5 F (36.9 C)   SpO2: 97%       Physical Exam  Vitals reviewed.   Constitutional:       Appearance: Normal appearance.   HENT:      Head: Normocephalic and atraumatic.      Right Ear: Tympanic membrane, ear canal and external ear normal.      Left Ear: Tympanic membrane, ear canal and external ear normal.      Nose: Nose normal.      Mouth/Throat:      Mouth: Mucous membranes are moist.      Pharynx: Oropharynx is clear.   Eyes:      Extraocular Movements: Extraocular movements intact.      Pupils: Pupils are equal, round, and reactive to light.   Cardiovascular:      Rate and Rhythm: Normal rate and regular rhythm.   Pulmonary:      Effort: Pulmonary effort is normal.      Breath sounds: Normal breath sounds.   Abdominal:      General: Abdomen is flat. Bowel sounds are normal. There is no distension.      Palpations: Abdomen is soft.      Tenderness: There is no abdominal tenderness.   Musculoskeletal:         General: Normal range of motion.      Cervical back: Normal range of motion and neck supple.   Skin:     General: Skin is warm and dry.   Neurological:      General: No focal deficit present.      Mental Status: He is alert and oriented to person, place, and time.   Psychiatric:         Mood and Affect: Mood normal.         Behavior: Behavior normal.  Judgment: Judgment normal.                  An electronic signature was used to authenticate this note.    --Leonidas Romberg, PA

## 2022-06-05 ENCOUNTER — Encounter

## 2022-06-08 ENCOUNTER — Encounter: Primary: Family Medicine

## 2022-06-08 ENCOUNTER — Encounter: Attending: Hematology | Primary: Family Medicine

## 2022-09-18 ENCOUNTER — Encounter: Attending: Family | Primary: Family Medicine

## 2024-02-15 ENCOUNTER — Other Ambulatory Visit: Payer: Self-pay

## 2024-02-15 ENCOUNTER — Other Ambulatory Visit: Payer: Self-pay | Admitting: *Deleted

## 2024-02-15 DIAGNOSIS — D696 Thrombocytopenia, unspecified: Secondary | ICD-10-CM

## 2024-02-15 DIAGNOSIS — K219 Gastro-esophageal reflux disease without esophagitis: Secondary | ICD-10-CM

## 2024-02-15 DIAGNOSIS — I10 Essential (primary) hypertension: Secondary | ICD-10-CM

## 2024-02-15 DIAGNOSIS — E785 Hyperlipidemia, unspecified: Secondary | ICD-10-CM

## 2024-02-15 MED ORDER — AMLODIPINE BESYLATE 10 MG PO TABS
10.0000 mg | ORAL_TABLET | Freq: Every day | ORAL | 0 refills | Status: AC
  Filled 2024-02-15: qty 30, 30d supply, fill #0

## 2024-02-15 MED ORDER — CHLORTHALIDONE 25 MG PO TABS
25.0000 mg | ORAL_TABLET | Freq: Every day | ORAL | 0 refills | Status: AC
  Filled 2024-02-15: qty 30, 30d supply, fill #0

## 2024-02-15 MED ORDER — ATORVASTATIN CALCIUM 20 MG PO TABS
20.0000 mg | ORAL_TABLET | Freq: Every day | ORAL | 0 refills | Status: AC
  Filled 2024-02-15: qty 30, 30d supply, fill #0

## 2024-02-15 MED ORDER — OMEPRAZOLE 20 MG PO CPDR
20.0000 mg | DELAYED_RELEASE_CAPSULE | Freq: Every day | ORAL | 0 refills | Status: AC
  Filled 2024-02-15: qty 30, 30d supply, fill #0

## 2024-02-15 MED ORDER — HYDROXYUREA 500 MG PO CAPS
500.0000 mg | ORAL_CAPSULE | ORAL | 0 refills | Status: AC
  Filled 2024-02-15: qty 70, 27d supply, fill #0

## 2024-02-15 NOTE — Progress Notes (Unsigned)
 Returned for care today after some time. Just completed the 90 day program (TROSA).  Clean for 90 days. Asked for refill for Hydrea , atorvastatin , chlorthalidone , amlodipine  and omeprazole . Requsting a hemoc referral hx thrombocytopenia

## 2024-02-22 ENCOUNTER — Other Ambulatory Visit: Payer: Self-pay | Admitting: *Deleted

## 2024-02-22 NOTE — Progress Notes (Signed)
 He came by for assistance to see the hematologist. He has an appointment with HD PCP 1/29. He understands that he needs an encounter with a provider. I may be able to get him scheduled with telehealth visit, otherwise he will keep 1/29 appointment

## 2024-02-29 ENCOUNTER — Telehealth: Admitting: Nurse Practitioner

## 2024-02-29 DIAGNOSIS — D473 Essential (hemorrhagic) thrombocythemia: Secondary | ICD-10-CM | POA: Diagnosis not present

## 2024-02-29 DIAGNOSIS — D649 Anemia, unspecified: Secondary | ICD-10-CM | POA: Diagnosis not present

## 2024-02-29 NOTE — Congregational Nurse Program (Signed)
" °  Dept: [phone]   Congregational Nurse Program Note  Date of Encounter: 02/29/2024  Past Medical History: No past medical history on file.  Encounter Details:  Community Questionnaire - 02/29/24 1230       Questionnaire   Ask client: Do you give verbal consent for me to treat you today? Yes    Student Assistance N/A    Location Patient Served  [facility]    Encounter Setting CN site    Population Status Unhoused    Insurance Medicaid    Insurance/Financial Assistance Referral N/A    Medication N/A    Medical Provider Yes    Medical Referrals Made NA    Medical Appointment Completed N/A    Screenings CN Performed (remember to also record results) NA    CN Interventions Case Management;Advocate/Support;Navigate Healthcare System    ED Visit Averted Yes          Client to RN office to inquire if RN had information about referral to hematology that was to be placed by Ronal Jenkins Houseman, NP. Message sent to [employer]. Ronal Jenkins requested that client see Lauraine Kitty, NP. RN able to assist with telehealth visit. NP sent referral and instructed client to call if he doesn't hear anything in 1-2 weeks. Client denies any other acute needs at this time.    "

## 2024-02-29 NOTE — Progress Notes (Signed)
 "  Acute Video Visit    Virtual Visit Consent:   Arthur Jensen, you are scheduled for a virtual visit with a [facility] provider today.     Just as with appointments in the office, your consent must be obtained to participate.  Your consent will be active for this visit and any virtual visit you may have with one of our providers in the next 365 days.     If you have a MyChart account, a copy of this consent can be sent to you electronically.  All virtual visits are billed to your insurance company just like a traditional visit in the office.    If the connection with a video visit is poor, the visit may have to be switched to a telephone visit.  With either a video or telephone visit, we are not always able to ensure that we have a secure connection.     I need to obtain your verbal consent now.   Are you willing to proceed with your visit today?    [name] has provided verbal consent on 02/29/2024 for a virtual visit (video or telephone).   Lauraine Kitty, FNP  Date: 02/29/2024 12:27 PM  Subjective:     Patient ID: Arthur Jensen, male    DOB: April 30, 1960, 64 y.o.   MRN: [id number]  LILLETTE Lauraine Kitty, connected with  SHINE MIKES  ([id number], [birth year]) on 02/29/24 at  1:00 PM EST by a video-enabled telemedicine application and verified that I am speaking with the correct person using two identifiers.   Location:  Patient: [facility]  Provider: Virtual Visit Location Provider: Home Office   I discussed the limitations of evaluation and management by telemedicine and the availability of in person appointments. The patient expressed understanding and agreed to proceed.     HPI  Arthur Jensen is a 64 y.o. who identifies as a male who was assigned male at birth, and is being seen today for referral to Hematology in [place].  He was previously under the care of [facility] in [place] but is unable to return due to relocation and lack of transportation  Previous Hematologist:  [facility] [phone]  Medical history is significant for anemia and thrombocytosis   Current treatment is hydroxyurea   Next scheduled follow up in February, would like assistance in transitioning care to [facility].   Presenting with assistance of the Congregational Nurse at the [facility] today for care. He has also bene established with Ronal Jenkins Houseman as a PCP in [place].     Denies acute needs today aside from assistance with referral        Objective:     Physical Exam Constitutional:      Appearance: Normal appearance.  HENT:     Nose: Nose normal.     Mouth/Throat:     Mouth: Mucous membranes are moist.  Pulmonary:     Effort: Pulmonary effort is normal.  Neurological:     Mental Status: He is oriented to person, place, and time.  Psychiatric:        Mood and Affect: Mood normal.          Assessment & Plan:   Follow up with PCP   1. Essential thrombocytosis (HCC) (Primary)  - Ambulatory referral to Hematology / Oncology  2. Anemia, unspecified type  - Ambulatory referral to Hematology / Oncology   Follow Up Instructions: I discussed the assessment and treatment plan with the patient. The patient  was provided an opportunity to ask questions and all were answered. The patient agreed with the plan and demonstrated an understanding of the instructions.  A copy of instructions were sent to the patient via MyChart unless otherwise noted below.    The patient was advised to call back or seek an in-person evaluation if the symptoms worsen or if the condition fails to improve as anticipated.    Lauraine Kitty, FNP  **Disclaimer: This note may have been dictated with voice recognition software. Similar sounding words can inadvertently be transcribed and this note may contain transcription errors which may not have been corrected upon publication of note.** "

## 2024-03-27 ENCOUNTER — Inpatient Hospital Stay: Admitting: Nurse Practitioner

## 2024-03-27 ENCOUNTER — Inpatient Hospital Stay
